# Patient Record
Sex: Female | Born: 1955 | Hispanic: Yes | Marital: Married | Smoking: Never smoker
Health system: Southern US, Community
[De-identification: ages and names within clinical notes are randomized; demographics above are authoritative.]

## PROBLEM LIST (undated history)

## (undated) DIAGNOSIS — G709 Myoneural disorder, unspecified: Secondary | ICD-10-CM

## (undated) DIAGNOSIS — E079 Disorder of thyroid, unspecified: Secondary | ICD-10-CM

## (undated) DIAGNOSIS — E119 Type 2 diabetes mellitus without complications: Secondary | ICD-10-CM

## (undated) DIAGNOSIS — K8050 Calculus of bile duct without cholangitis or cholecystitis without obstruction: Secondary | ICD-10-CM

## (undated) DIAGNOSIS — G35 Multiple sclerosis: Secondary | ICD-10-CM

## (undated) DIAGNOSIS — E785 Hyperlipidemia, unspecified: Secondary | ICD-10-CM

## (undated) HISTORY — DX: Calculus of bile duct without cholangitis or cholecystitis without obstruction: K80.50

---

## 2018-01-11 ENCOUNTER — Other Ambulatory Visit: Payer: Self-pay

## 2018-01-11 ENCOUNTER — Emergency Department: Payer: Medicare Other

## 2018-01-11 ENCOUNTER — Inpatient Hospital Stay
Admission: EM | Admit: 2018-01-11 | Discharge: 2018-01-13 | DRG: 446 | Disposition: A | Discharge status: Home / Self Care | Payer: Medicare Other | Attending: Internal Medicine | Admitting: Internal Medicine

## 2018-01-11 DIAGNOSIS — Z993 Dependence on wheelchair: Secondary | ICD-10-CM | POA: Diagnosis not present

## 2018-01-11 DIAGNOSIS — R52 Pain, unspecified: Secondary | ICD-10-CM

## 2018-01-11 DIAGNOSIS — E039 Hypothyroidism, unspecified: Secondary | ICD-10-CM | POA: Diagnosis not present

## 2018-01-11 DIAGNOSIS — Z7984 Long term (current) use of oral hypoglycemic drugs: Secondary | ICD-10-CM | POA: Diagnosis not present

## 2018-01-11 DIAGNOSIS — K8050 Calculus of bile duct without cholangitis or cholecystitis without obstruction: Secondary | ICD-10-CM

## 2018-01-11 DIAGNOSIS — E119 Type 2 diabetes mellitus without complications: Secondary | ICD-10-CM | POA: Diagnosis present

## 2018-01-11 DIAGNOSIS — Z79899 Other long term (current) drug therapy: Secondary | ICD-10-CM

## 2018-01-11 DIAGNOSIS — K8044 Calculus of bile duct with chronic cholecystitis without obstruction: Secondary | ICD-10-CM | POA: Diagnosis present

## 2018-01-11 DIAGNOSIS — G35 Multiple sclerosis: Secondary | ICD-10-CM | POA: Diagnosis present

## 2018-01-11 DIAGNOSIS — E785 Hyperlipidemia, unspecified: Secondary | ICD-10-CM | POA: Diagnosis present

## 2018-01-11 DIAGNOSIS — Z7401 Bed confinement status: Secondary | ICD-10-CM | POA: Diagnosis not present

## 2018-01-11 HISTORY — DX: Type 2 diabetes mellitus without complications: E11.9

## 2018-01-11 HISTORY — DX: Hyperlipidemia, unspecified: E78.5

## 2018-01-11 HISTORY — DX: Multiple sclerosis: G35

## 2018-01-11 HISTORY — DX: Calculus of bile duct without cholangitis or cholecystitis without obstruction: K80.50

## 2018-01-11 HISTORY — DX: Disorder of thyroid, unspecified: E07.9

## 2018-01-11 LAB — COMPREHENSIVE METABOLIC PANEL
ALT: 213 U/L — ABNORMAL HIGH (ref 14–54)
AST: 79 U/L — ABNORMAL HIGH (ref 15–41)
Albumin: 3.7 g/dL (ref 3.5–5.0)
Alkaline Phosphatase: 179 U/L — ABNORMAL HIGH (ref 38–126)
Anion gap: 11 (ref 5–15)
BUN: 7 mg/dL (ref 6–20)
CO2: 25 mmol/L (ref 22–32)
Calcium: 9.6 mg/dL (ref 8.9–10.3)
Chloride: 104 mmol/L (ref 101–111)
Creatinine, Ser: 0.57 mg/dL (ref 0.44–1.00)
Glucose, Bld: 143 mg/dL — ABNORMAL HIGH (ref 65–99)
POTASSIUM: 4.2 mmol/L (ref 3.5–5.1)
Sodium: 140 mmol/L (ref 135–145)
Total Bilirubin: 1.6 mg/dL — ABNORMAL HIGH (ref 0.3–1.2)
Total Protein: 7.7 g/dL (ref 6.5–8.1)

## 2018-01-11 LAB — CBC
HEMATOCRIT: 42.5 % (ref 35.0–47.0)
Hemoglobin: 14 g/dL (ref 12.0–16.0)
MCH: 29.1 pg (ref 26.0–34.0)
MCHC: 32.9 g/dL (ref 32.0–36.0)
MCV: 88.5 fL (ref 80.0–100.0)
PLATELETS: 361 10*3/uL (ref 150–440)
RBC: 4.81 MIL/uL (ref 3.80–5.20)
RDW: 14.7 % — ABNORMAL HIGH (ref 11.5–14.5)
WBC: 9.9 10*3/uL (ref 3.6–11.0)

## 2018-01-11 LAB — GLUCOSE, CAPILLARY: GLUCOSE-CAPILLARY: 106 mg/dL — AB (ref 65–99)

## 2018-01-11 LAB — LIPASE, BLOOD: LIPASE: 47 U/L (ref 11–51)

## 2018-01-11 LAB — BILIRUBIN, DIRECT: Bilirubin, Direct: 0.5 mg/dL (ref 0.1–0.5)

## 2018-01-11 MED ORDER — OXYCODONE HCL 5 MG PO TABS
5.0000 mg | ORAL_TABLET | ORAL | Status: DC | PRN
  Administered 2018-01-13: 5 mg via ORAL
  Filled 2018-01-11: qty 1

## 2018-01-11 MED ORDER — ACETAMINOPHEN 325 MG PO TABS: 650.0000 mg | ORAL_TABLET | Freq: Four times a day (QID) | ORAL | Status: DC | PRN

## 2018-01-11 MED ORDER — ONDANSETRON HCL 4 MG PO TABS: 4.0000 mg | ORAL_TABLET | Freq: Four times a day (QID) | ORAL | Status: DC | PRN

## 2018-01-11 MED ORDER — ACETAMINOPHEN 650 MG RE SUPP: 650.0000 mg | Freq: Four times a day (QID) | RECTAL | Status: DC | PRN

## 2018-01-11 MED ORDER — MORPHINE SULFATE (PF) 2 MG/ML IV SOLN
2.0000 mg | INTRAVENOUS | Status: DC | PRN
Start: 2018-01-11 — End: 2018-01-13

## 2018-01-11 MED ORDER — ONDANSETRON HCL 4 MG/2ML IJ SOLN
4.0000 mg | Freq: Four times a day (QID) | INTRAMUSCULAR | Status: DC | PRN
Start: 2018-01-11 — End: 2018-01-13

## 2018-01-11 MED ORDER — SODIUM CHLORIDE 0.9 % IV SOLN
INTRAVENOUS | Status: DC
  Administered 2018-01-11 – 2018-01-13 (×3): via INTRAVENOUS

## 2018-01-11 MED ORDER — INSULIN ASPART 100 UNIT/ML ~~LOC~~ SOLN
0.0000 [IU] | Freq: Three times a day (TID) | SUBCUTANEOUS | Status: DC
  Administered 2018-01-12 – 2018-01-13 (×2): 1 [IU] via SUBCUTANEOUS
  Filled 2018-01-11: qty 0.09
  Filled 2018-01-11: qty 1
  Filled 2018-01-11 (×5): qty 0.09
  Filled 2018-01-11: qty 1
  Filled 2018-01-11 (×3): qty 0.09

## 2018-01-11 MED ORDER — HEPARIN SODIUM (PORCINE) 5000 UNIT/ML IJ SOLN
5000.0000 [IU] | Freq: Three times a day (TID) | INTRAMUSCULAR | Status: DC
  Administered 2018-01-12: 5000 [IU] via SUBCUTANEOUS
  Filled 2018-01-11 (×2): qty 1

## 2018-01-11 NOTE — H&P (Signed)
[employer] [facility] [place] [facility]   PATIENT NAME: Desiree Espinoza    MR#:  [id number]  DATE OF BIRTH:  [birth year]  DATE OF ADMISSION:  01/11/2018  PRIMARY CARE PHYSICIAN: No primary care provider on file.   REQUESTING/REFERRING PHYSICIAN:  Merrily Brittle, MD     CHIEF COMPLAINT:   Chief Complaint  Patient presents with  . Abdominal Pain    HISTORY OF PRESENT ILLNESS: Desiree Espinoza  is a 62 y.o. female with a known history of diabetes type 2, hyperlipidemia, MS, hypothyroidism who is presenting with abdominal pain ongoing for about a week.  Patient states that the pain is all over.  It alternates with sharp pain with dull pain.  Also has associated nausea and vomiting.  She has not had any fevers or chills.  Patient in the emergency room is noted to have  gallstones with with likely stone in the common bile duct.  The ER physician has discussed the case with surgery who recommends admission to the medicine service and GI consult for ERCP. PAST MEDICAL HISTORY:   Past Medical History:  Diagnosis Date  . Diabetes mellitus without complication (HCC)   . Hyperlipemia   . MS (multiple sclerosis) (HCC)   . Thyroid disease     PAST SURGICAL HISTORY: History reviewed. No pertinent surgical history.  SOCIAL HISTORY:  Social History   Tobacco Use  . Smoking status: Never Smoker  . Smokeless tobacco: Never Used  Substance Use Topics  . Alcohol use: No    Frequency: Never    FAMILY HISTORY: No family history on file.  DRUG ALLERGIES: No Known Allergies  REVIEW OF SYSTEMS:   CONSTITUTIONAL: No fever, fatigue or weakness.  EYES: No blurred or double vision.  EARS, NOSE, AND THROAT: No tinnitus or ear pain.  RESPIRATORY: No cough, shortness of breath, wheezing or hemoptysis.  CARDIOVASCULAR: No chest pain, orthopnea, edema.  GASTROINTESTINAL: No nausea, vomiting, diarrhea or positive abdominal pain.  GENITOURINARY: No dysuria, hematuria.  ENDOCRINE: No  polyuria, nocturia,  HEMATOLOGY: No anemia, easy bruising or bleeding SKIN: No rash or lesion. MUSCULOSKELETAL: No joint pain or arthritis.   NEUROLOGIC: No tingling, numbness, weakness.  PSYCHIATRY: No anxiety or depression.   MEDICATIONS AT HOME:  Prior to Admission medications   Not on File      PHYSICAL EXAMINATION:   VITAL SIGNS: Blood pressure 117/75, pulse (!) 101, temperature 97.6 F (36.4 C), temperature source Oral, height 5\' 4"  (1.626 m), weight 140 lb (63.5 kg), SpO2 98 %.  GENERAL:  62 y.o.-year-old patient lying in the bed with no acute distress.  EYES: Pupils equal, round, reactive to light and accommodation. No scleral icterus. Extraocular muscles intact.  HEENT: Head atraumatic, normocephalic. Oropharynx and nasopharynx clear.  NECK:  Supple, no jugular venous distention. No thyroid enlargement, no tenderness.  LUNGS: Normal breath sounds bilaterally, no wheezing, rales,rhonchi or crepitation. No use of accessory muscles of respiration.  CARDIOVASCULAR: S1, S2 normal. No murmurs, rubs, or gallops.  ABDOMEN: Soft, right upper quadrant tenderness, nondistended. Bowel sounds present. No organomegaly or mass.  EXTREMITIES: No pedal edema, cyanosis, or clubbing.  NEUROLOGIC: Cranial nerves II through XII are intact. Muscle strength 5/5 in all extremities. Sensation intact. Gait not checked.  PSYCHIATRIC: The patient is alert and oriented x 3.  SKIN: No obvious rash, lesion, or ulcer.   LABORATORY PANEL:   CBC Recent Labs  Lab 01/11/18 1654  WBC 9.9  HGB 14.0  HCT 42.5  PLT 361  MCV 88.5  MCH 29.1  MCHC 32.9  RDW 14.7*   ------------------------------------------------------------------------------------------------------------------  Chemistries  Recent Labs  Lab 01/11/18 1654  NA 140  K 4.2  CL 104  CO2 25  GLUCOSE 143*  BUN 7  CREATININE 0.57  CALCIUM 9.6  AST 79*  ALT 213*  ALKPHOS 179*  BILITOT 1.6*    ------------------------------------------------------------------------------------------------------------------ estimated creatinine clearance is 65.4 mL/min (by C-G formula based on SCr of 0.57 mg/dL). ------------------------------------------------------------------------------------------------------------------ No results for input(s): TSH, T4TOTAL, T3FREE, THYROIDAB in the last 72 hours.  Invalid input(s): FREET3   Coagulation profile No results for input(s): INR, PROTIME in the last 168 hours. ------------------------------------------------------------------------------------------------------------------- No results for input(s): DDIMER in the last 72 hours. -------------------------------------------------------------------------------------------------------------------  Cardiac Enzymes No results for input(s): CKMB, TROPONINI, MYOGLOBIN in the last 168 hours.  Invalid input(s): CK ------------------------------------------------------------------------------------------------------------------ Invalid input(s): POCBNP  ---------------------------------------------------------------------------------------------------------------  Urinalysis No results found for: COLORURINE, APPEARANCEUR, LABSPEC, PHURINE, GLUCOSEU, HGBUR, BILIRUBINUR, KETONESUR, PROTEINUR, UROBILINOGEN, NITRITE, LEUKOCYTESUR   RADIOLOGY: Us Abdomen Limited Ruq  Result Date: 01/11/2018 CLINICAL DATA:  Epigastric pain.  Discolored stools. EXAM: ULTRASOUND ABDOMEN LIMITED RIGHT UPPER QUADRANT COMPARISON:  None. FINDINGS: Gallbladder: There is dense shadowing associated with the gallbladder suggesting a gallbladder filled with stones. No wall thickening appreciated. No pericholecystic fluid noted. A Murphy's sign is reported. Common bile duct: Diameter: 8.5 mm There is a 12.7 mm filling defect within the common bile duct. Liver: Increased echogenicity in the liver. Intrahepatic ductal dilatation noted.  Shadowing echogenicity in the liver may be associated with pneumobilia. Portal vein is patent on color Doppler imaging with normal direction of blood flow towards the liver. IMPRESSION: 1. There is a 12.7 mm filling defect in the common bile duct. There is common bile duct dilatation. Recommend an MRCP for better evaluation. 2. There is dense shadowing associated with the gallbladder suggesting a gallbladder filled with stones. A positive Murphy's sign is reported. No wall thickening or pericholecystic fluid. Recommend attention on MRCP. 3. Intrahepatic ductal dilatation. Shadowing echogenicity could represent pneumobilia. Recommend attention on MRCP. Electronically Signed   By: David  Williams III M.D   On: 01/11/2018 18:26    EKG: No orders found for this or any previous visit.  IMPRESSION AND PLAN: Patient is a 62 year old female with abdominal pain  1.  Choledocholithiasis Related to gallstones I have discussed with Dr. Toledo of GI who will discuss with Dr. Wall regarding ERCP tomorrow Patient will need surgery consult past the ERCP for cholecystectomy  2, diabetes type 2 hold her oral medications placed on sliding scale insulin  3.  Hyperlipidemia hold her medications  4.  Multiple sclerosis  5.  Miscellaneous SCDs for DVT prophylaxis  All the records are reviewed and case discussed with ED provider. Management plans discussed with the patient, family and they are in agreement.  CODE STATUS: Code Status History    This patient does not have a recorded code status. Please follow your organizational policy for patients in this situation.       TOTAL TIME TAKING CARE OF THIS PATIENT: <MEASURE<MEASUREME<MEASUREME<MEASUREME<MEASUREME<MEASUREME<[id number] Carolinal M.D on 01/11/2018 at 8:10 PM  Between 7am to 6pm - Pager - [phone]  After 6pm go to [email or web address] - password [email or web address]  [employer] Office  [phone]  CC: Primary care physician; No primary care provider on file.

## 2018-01-11 NOTE — Progress Notes (Signed)
Time hr behind 

## 2018-01-11 NOTE — ED Provider Notes (Signed)
[facility] Emergency Department Provider Note  ____________________________________________   First MD Initiated Contact with Patient 01/11/18 1931     (approximate)  I have reviewed the triage vital signs and the nursing notes.   HISTORY  Chief Complaint Abdominal Pain    HPI Desiree Espinoza is a 62 y.o. female who self presents to the emergency department with abdominal pain and change in color in her stools and urine.  She had 2-3 days of upper abdominal mild to moderate cramping discomfort.  Seems to be somewhat worse after eating and somewhat improved with rest.  Today her urine became quite dark in her stool became nearly silver colored which concerned her so she came to the emergency department.  She denies fevers or chills.  She does report nausea and has vomited twice.  She has a complex past medical history including dyslipidemia, multiple sclerosis, diabetes mellitus.  Nothing in particular seems to make her symptoms better or worse.  Past Medical History:  Diagnosis Date  . Diabetes mellitus without complication (HCC)   . Hyperlipemia   . MS (multiple sclerosis) (HCC)   . Thyroid disease     Patient Active Problem List   Diagnosis Date Noted  . Choledocholithiasis 01/11/2018    Past Surgical History:  Procedure Laterality Date  . ENDOSCOPIC RETROGRADE CHOLANGIOPANCREATOGRAPHY (ERCP) WITH PROPOFOL N/A 01/12/2018   Procedure: ENDOSCOPIC RETROGRADE CHOLANGIOPANCREATOGRAPHY (ERCP) WITH PROPOFOL;  Surgeon: Midge Minium, MD;  Location: [facility] ENDOSCOPY;  Service: Endoscopy;  Laterality: N/A;    Prior to Admission medications   Medication Sig Start Date End Date Taking? Authorizing Provider  atorvastatin (LIPITOR) 40 MG tablet Take 40 mg by mouth daily. 12/19/17  Yes [provider]  glipiZIDE (GLUCOTROL) 10 MG tablet Take 10 mg by mouth 2 (two) times daily. 12/19/17  Yes [provider]  JANUVIA 100 MG tablet Take 100 mg by mouth  daily. 11/22/17  Yes [provider]    Allergies Patient has no known allergies.  History reviewed. No pertinent family history.  Social History Social History   Tobacco Use  . Smoking status: Never Smoker  . Smokeless tobacco: Never Used  Substance Use Topics  . Alcohol use: No    Frequency: Never  . Drug use: No    Review of Systems Constitutional: No fever/chills Eyes: No visual changes. ENT: No sore throat. Cardiovascular: Denies chest pain. Respiratory: Denies shortness of breath. Gastrointestinal: Positive for abdominal pain.  Positive for nausea, positive for vomiting.  No diarrhea.  No constipation. Genitourinary: Negative for dysuria. Musculoskeletal: Negative for back pain. Skin: Negative for rash. Neurological: Negative for headaches, focal weakness or numbness.   ____________________________________________   PHYSICAL EXAM:  VITAL SIGNS: ED Triage Vitals  Enc Vitals Group     BP 01/11/18 1649 117/75     Pulse Rate 01/11/18 1649 (!) 101     Resp --      Temp 01/11/18 1649 97.6 F (36.4 C)     Temp Source 01/11/18 1649 Oral     SpO2 01/11/18 1649 98 %     Weight 01/11/18 1651 140 lb (63.5 kg)     Height 01/11/18 1651 5\' 4"  (1.626 m)     Head Circumference --      Peak Flow --      Pain Score --      Pain Loc --      Pain Edu? --      Excl. in GC? --     Constitutional:  Alert and oriented x4 sitting up in her wheelchair and appears somewhat uncomfortable nontoxic no diaphoresis speaks in full clear sentences Eyes: PERRL EOMI. Head: Atraumatic. Nose: No congestion/rhinnorhea. Mouth/Throat: No trismus Neck: No stridor.   Cardiovascular: Tachycardic with rate, regular rhythm. Grossly normal heart sounds.  Good peripheral circulation. Respiratory: Normal respiratory effort.  No retractions. Lungs CTAB and moving good air Gastrointestinal: Soft abdomen mild upper abdominal tenderness with negative Murphy's and no frank  peritonitis Musculoskeletal: No lower extremity edema   Neurologic:  Normal speech and language. No gross focal neurologic deficits are appreciated. Skin:  Skin is warm, dry and intact. No rash noted. Psychiatric: Mood and affect are normal. Speech and behavior are normal.    ____________________________________________   DIFFERENTIAL includes but not limited to  Biliary colic, cholecystitis, cholangitis, choledocholithiasis ____________________________________________   LABS (all labs ordered are listed, but only abnormal results are displayed)  Labs Reviewed  COMPREHENSIVE METABOLIC PANEL - Abnormal; Notable for the following components:      Result Value   Glucose, Bld 143 (*)    AST 79 (*)    ALT 213 (*)    Alkaline Phosphatase 179 (*)    Total Bilirubin 1.6 (*)    All other components within normal limits  CBC - Abnormal; Notable for the following components:   RDW 14.7 (*)    All other components within normal limits  URINALYSIS, COMPLETE (UACMP) WITH MICROSCOPIC - Abnormal; Notable for the following components:   Color, Urine AMBER (*)    APPearance CLOUDY (*)    Leukocytes, UA LARGE (*)    Squamous Epithelial / LPF 0-5 (*)    All other components within normal limits  CBC - Abnormal; Notable for the following components:   RDW 14.7 (*)    All other components within normal limits  COMPREHENSIVE METABOLIC PANEL - Abnormal; Notable for the following components:   Glucose, Bld 108 (*)    Calcium 8.5 (*)    Albumin 3.1 (*)    AST 63 (*)    ALT 164 (*)    Alkaline Phosphatase 132 (*)    Total Bilirubin 1.4 (*)    All other components within normal limits  GLUCOSE, CAPILLARY - Abnormal; Notable for the following components:   Glucose-Capillary 106 (*)    All other components within normal limits  GLUCOSE, CAPILLARY - Abnormal; Notable for the following components:   Glucose-Capillary 100 (*)    All other components within normal limits  GLUCOSE, CAPILLARY -  Abnormal; Notable for the following components:   Glucose-Capillary 137 (*)    All other components within normal limits  COMPREHENSIVE METABOLIC PANEL - Abnormal; Notable for the following components:   Glucose, Bld 145 (*)    BUN <5 (*)    Calcium 8.6 (*)    Albumin 3.4 (*)    AST 57 (*)    ALT 144 (*)    Alkaline Phosphatase 148 (*)    Total Bilirubin 1.4 (*)    All other components within normal limits  GLUCOSE, CAPILLARY - Abnormal; Notable for the following components:   Glucose-Capillary 171 (*)    All other components within normal limits  GLUCOSE, CAPILLARY - Abnormal; Notable for the following components:   Glucose-Capillary 136 (*)    All other components within normal limits  GLUCOSE, CAPILLARY - Abnormal; Notable for the following components:   Glucose-Capillary 137 (*)    All other components within normal limits  GLUCOSE, CAPILLARY - Abnormal; Notable for the following  components:   Glucose-Capillary 140 (*)    All other components within normal limits  LIPASE, BLOOD  BILIRUBIN, DIRECT  GLUCOSE, CAPILLARY  GLUCOSE, CAPILLARY  LIPASE, BLOOD    Lab work reviewed by me with transaminitis and elevated bilirubin concerning for obstruction __________________________________________  EKG   ____________________________________________  RADIOLOGY  Right upper quadrant ultrasound reviewed by me concerning for biliary obstruction and choledocholithiasis ____________________________________________   PROCEDURES  Procedure(s) performed: no  Procedures  Critical Care performed: no  Observation: no ____________________________________________   INITIAL IMPRESSION / ASSESSMENT AND PLAN / ED COURSE  Pertinent labs & imaging results that were available during my care of the patient were reviewed by me and considered in my medical decision making (see chart for details).  The patient arrives with upper abdominal discomfort and nausea along with change in  urine and stools and elevated transaminases concerning for choledocholithiasis.  Right upper quadrant ultrasound confirms gallstones as well as biliary dilation.     ----------------------------------------- 7:31 PM on 01/11/2018 -----------------------------------------  I discussed the case with on-call general surgeon Dr. Tonita Cong who recommends admission to the medicine service for GI consultation and ERCP.  I discussed with the patient and family who verbalized understanding and agreement with the plan.  I then discussed with the hospitalist who has graciously agreed to admit the patient to his service. ____________________________________________   FINAL CLINICAL IMPRESSION(S) / ED DIAGNOSES  Final diagnoses:  Pain  Choledocholithiasis      NEW MEDICATIONS STARTED DURING THIS VISIT:  Discharge Medication List as of 01/13/2018 11:58 AM       Note:  This document was prepared using Dragon voice recognition software and may include unintentional dictation errors.     Merrily Brittle, MD 01/15/18 1325

## 2018-01-11 NOTE — ED Triage Notes (Signed)
Pt c/o abd pain with silver colored stool for the past couple of days and also having dark colored urine, N/V x2.Marland Kitchen

## 2018-01-12 ENCOUNTER — Encounter
Admission: EM | Disposition: A | Discharge status: Home / Self Care | Payer: Self-pay | Source: Home / Self Care | Attending: Internal Medicine

## 2018-01-12 ENCOUNTER — Inpatient Hospital Stay: Payer: Medicare Other | Admitting: Anesthesiology

## 2018-01-12 ENCOUNTER — Encounter: Payer: Self-pay | Admitting: Anesthesiology

## 2018-01-12 ENCOUNTER — Inpatient Hospital Stay: Payer: Medicare Other

## 2018-01-12 DIAGNOSIS — K8050 Calculus of bile duct without cholangitis or cholecystitis without obstruction: Secondary | ICD-10-CM | POA: Diagnosis not present

## 2018-01-12 HISTORY — PX: ENDOSCOPIC RETROGRADE CHOLANGIOPANCREATOGRAPHY (ERCP) WITH PROPOFOL: SHX5810

## 2018-01-12 LAB — CBC
HEMATOCRIT: 37.7 % (ref 35.0–47.0)
Hemoglobin: 12.6 g/dL (ref 12.0–16.0)
MCH: 29.3 pg (ref 26.0–34.0)
MCHC: 33.6 g/dL (ref 32.0–36.0)
MCV: 87.3 fL (ref 80.0–100.0)
Platelets: 325 10*3/uL (ref 150–440)
RBC: 4.32 MIL/uL (ref 3.80–5.20)
RDW: 14.7 % — ABNORMAL HIGH (ref 11.5–14.5)
WBC: 8.4 10*3/uL (ref 3.6–11.0)

## 2018-01-12 LAB — URINALYSIS, COMPLETE (UACMP) WITH MICROSCOPIC
Bacteria, UA: NONE SEEN
Bilirubin Urine: NEGATIVE
GLUCOSE, UA: NEGATIVE mg/dL
Hgb urine dipstick: NEGATIVE
Ketones, ur: NEGATIVE mg/dL
Nitrite: NEGATIVE
PH: 5 (ref 5.0–8.0)
PROTEIN: NEGATIVE mg/dL
Specific Gravity, Urine: 1.011 (ref 1.005–1.030)

## 2018-01-12 LAB — COMPREHENSIVE METABOLIC PANEL
ALBUMIN: 3.1 g/dL — AB (ref 3.5–5.0)
ALT: 164 U/L — ABNORMAL HIGH (ref 14–54)
ANION GAP: 8 (ref 5–15)
AST: 63 U/L — ABNORMAL HIGH (ref 15–41)
Alkaline Phosphatase: 132 U/L — ABNORMAL HIGH (ref 38–126)
BILIRUBIN TOTAL: 1.4 mg/dL — AB (ref 0.3–1.2)
BUN: 6 mg/dL (ref 6–20)
CO2: 25 mmol/L (ref 22–32)
Calcium: 8.5 mg/dL — ABNORMAL LOW (ref 8.9–10.3)
Chloride: 106 mmol/L (ref 101–111)
Creatinine, Ser: 0.56 mg/dL (ref 0.44–1.00)
GFR calc Af Amer: >60 mL/min (ref >60)
GFR calc non Af Amer: >60 mL/min (ref >60)
GLUCOSE: 108 mg/dL — AB (ref 65–99)
POTASSIUM: 3.8 mmol/L (ref 3.5–5.1)
SODIUM: 139 mmol/L (ref 135–145)
TOTAL PROTEIN: 6.6 g/dL (ref 6.5–8.1)

## 2018-01-12 LAB — GLUCOSE, CAPILLARY
GLUCOSE-CAPILLARY: 92 mg/dL (ref 65–99)
GLUCOSE-CAPILLARY: 90 mg/dL (ref 65–99)
GLUCOSE-CAPILLARY: 137 mg/dL — AB (ref 65–99)
Glucose-Capillary: 100 mg/dL — ABNORMAL HIGH (ref 65–99)

## 2018-01-12 SURGERY — ENDOSCOPIC RETROGRADE CHOLANGIOPANCREATOGRAPHY (ERCP) WITH PROPOFOL
Anesthesia: General

## 2018-01-12 MED ORDER — ENOXAPARIN SODIUM 40 MG/0.4ML ~~LOC~~ SOLN
40.0000 mg | SUBCUTANEOUS | Status: DC
  Administered 2018-01-12: 40 mg via SUBCUTANEOUS
  Filled 2018-01-12: qty 0.4

## 2018-01-12 MED ORDER — LABETALOL HCL 5 MG/ML IV SOLN
INTRAVENOUS | Status: DC | PRN
  Administered 2018-01-12 (×2): 5 mg via INTRAVENOUS

## 2018-01-12 MED ORDER — LIDOCAINE HCL (PF) 2 % IJ SOLN
INTRAMUSCULAR | Status: AC
  Filled 2018-01-12: qty 10

## 2018-01-12 MED ORDER — PROPOFOL 10 MG/ML IV BOLUS
INTRAVENOUS | Status: AC
  Filled 2018-01-12: qty 20

## 2018-01-12 MED ORDER — SUCCINYLCHOLINE CHLORIDE 20 MG/ML IJ SOLN
INTRAMUSCULAR | Status: AC
  Filled 2018-01-12: qty 1

## 2018-01-12 MED ORDER — LIDOCAINE HCL (CARDIAC) 20 MG/ML IV SOLN
INTRAVENOUS | Status: DC | PRN
  Administered 2018-01-12: 80 mg via INTRAVENOUS

## 2018-01-12 MED ORDER — LABETALOL HCL 5 MG/ML IV SOLN
INTRAVENOUS | Status: AC
  Filled 2018-01-12: qty 4

## 2018-01-12 MED ORDER — INDOMETHACIN 50 MG RE SUPP
RECTAL | Status: AC
  Filled 2018-01-12: qty 2

## 2018-01-12 MED ORDER — INDOMETHACIN 50 MG RE SUPP
100.0000 mg | Freq: Once | RECTAL | Status: AC
  Administered 2018-01-12: 100 mg via RECTAL
  Filled 2018-01-12: qty 2

## 2018-01-12 MED ORDER — PROPOFOL 10 MG/ML IV BOLUS
INTRAVENOUS | Status: DC | PRN
  Administered 2018-01-12: 60 mg via INTRAVENOUS

## 2018-01-12 MED ORDER — ONDANSETRON HCL 4 MG/2ML IJ SOLN
INTRAMUSCULAR | Status: AC
  Filled 2018-01-12: qty 2

## 2018-01-12 MED ORDER — PROPOFOL 500 MG/50ML IV EMUL
INTRAVENOUS | Status: DC | PRN
  Administered 2018-01-12: 200 ug/kg/min via INTRAVENOUS

## 2018-01-12 MED ORDER — ROCURONIUM BROMIDE 50 MG/5ML IV SOLN
INTRAVENOUS | Status: AC
  Filled 2018-01-12: qty 1

## 2018-01-12 MED ORDER — DEXAMETHASONE SODIUM PHOSPHATE 10 MG/ML IJ SOLN
INTRAMUSCULAR | Status: AC
  Filled 2018-01-12: qty 1

## 2018-01-12 NOTE — Op Note (Signed)
[facility] Patient Name: Desiree Espinoza Procedure Date: 01/12/2018 12:37 PM MRN: [id number] Account #: [id number] Date of Birth: [birth year] Admit Type: Inpatient Age: 62 Room: [facility] ENDO ROOM 4 Gender: Female Note Status: Finalized Procedure:            ERCP Indications:          Bile duct stone(s) Providers:            Midge Minium MD, MD Referring MD:         No Local Md, MD (Referring MD) Medicines:            Propofol per Anesthesia Complications:        No immediate complications. Procedure:            Pre-Anesthesia Assessment:                       - Prior to the procedure, a History and Physical was                        performed, and patient medications and allergies were                        reviewed. The patient's tolerance of previous                        anesthesia was also reviewed. The risks and benefits of                        the procedure and the sedation options and risks were                        discussed with the patient. All questions were                        answered, and informed consent was obtained. Prior                        Anticoagulants: The patient has taken no previous                        anticoagulant or antiplatelet agents. ASA Grade                        Assessment: II - A patient with mild systemic disease.                        After reviewing the risks and benefits, the patient was                        deemed in satisfactory condition to undergo the                        procedure.                       After obtaining informed consent, the scope was passed                        under direct vision. Throughout the procedure, the  patient's blood pressure, pulse, and oxygen saturations                        were monitored continuously. The ERCP was introduced                        through the mouth, and used to inject contrast into and                        used  to inject contrast into the bile duct. The ERCP                        was accomplished without difficulty. The patient                        tolerated the procedure well. Findings:      The scout film was normal. The major papilla was bulging. A straight       Roadrunner wire was passed into the biliary tree. The short-nosed       traction sphincterotome was passed over the guidewire and the bile duct       was then deeply cannulated. Contrast was injected. I personally       interpreted the bile duct images. There was brisk flow of contrast       through the ducts. Image quality was excellent. Contrast extended to the       entire biliary tree. The main bile duct contained multiple stones mm. A       10 mm biliary sphincterotomy was made with a traction (standard)       sphincterotome using ERBE electrocautery. There was no       post-sphincterotomy bleeding. The biliary tree was swept with a 15 mm       balloon starting at the bifurcation. All stones were removed. Impression:           - The major papilla appeared to be bulging.                       - Choledocholithiasis was found. Complete removal was                        accomplished by biliary sphincterotomy and balloon                        extraction.                       - A biliary sphincterotomy was performed.                       - The biliary tree was swept. Recommendation:       - Return patient to hospital ward for ongoing care.                       - Watch for pancreatitis, bleeding, perforation, and                        cholangitis.                       - Clear liquid diet today. Procedure Code(s):    --- Professional ---  [phone], Endoscopic retrograde cholangiopancreatography                        (ERCP); with removal of calculi/debris from                        biliary/pancreatic duct(s)                       [id number], Endoscopic retrograde cholangiopancreatography                         (ERCP); with sphincterotomy/papillotomy                       [phone], Endoscopic catheterization of the biliary ductal                        system, radiological supervision and interpretation Diagnosis Code(s):    --- Professional ---                       K80.50, Calculus of bile duct without cholangitis or                        cholecystitis without obstruction CPT copyright 2016 American Medical Association. All rights reserved. The codes documented in this report are preliminary and upon coder review may  be revised to meet current compliance requirements. Midge Minium MD, MD 01/12/2018 1:24:24 PM This report has been signed electronically. Number of Addenda: 0 Note Initiated On: 01/12/2018 12:37 PM      [facility]

## 2018-01-12 NOTE — Transfer of Care (Signed)
2Immediate Anesthesia Transfer of Care Note  Patient: Desiree Espinoza  Procedure(s) Performed: ENDOSCOPIC RETROGRADE CHOLANGIOPANCREATOGRAPHY (ERCP) WITH PROPOFOL (N/A )  Patient Location: PACU  Anesthesia Type:General  Level of Consciousness: awake  Airway & Oxygen Therapy: Patient Spontanous Breathing  Post-op Assessment: Report given to RN  Post vital signs: stable  Last Vitals:  Vitals:   01/12/18 1325 01/12/18 1327  BP:    Pulse:  (!) 115  Resp:  19  Temp: (!) 35.9 C (!) 35.9 C  SpO2:  94%    Last Pain:  Vitals:   01/12/18 1327  TempSrc: Skin  PainSc:          Complications: No apparent anesthesia complications

## 2018-01-12 NOTE — Progress Notes (Signed)
[facility] [place] at [facility]   PATIENT NAME: Desiree Espinoza    MR#:  [id number]  DATE OF BIRTH:  Nov 18, 1955  SUBJECTIVE:  CHIEF COMPLAINT:   Chief Complaint  Patient presents with  . Abdominal Pain   - admitted with abdominal pain and noted to have common bile duct stone. -Pain is improving. For ERCP today  REVIEW OF SYSTEMS:  Review of Systems  Constitutional: Positive for malaise/fatigue. Negative for chills and fever.  HENT: Negative for congestion, ear discharge, hearing loss and nosebleeds.   Eyes: Negative for blurred vision and double vision.  Respiratory: Negative for cough, shortness of breath and wheezing.   Cardiovascular: Negative for chest pain and palpitations.  Gastrointestinal: Positive for abdominal pain. Negative for constipation, diarrhea, nausea and vomiting.  Genitourinary: Negative for dysuria.  Neurological: Positive for focal weakness. Negative for dizziness, seizures and headaches.    DRUG ALLERGIES:  No Known Allergies  VITALS:  Blood pressure 135/67, pulse (!) 107, temperature 98.1 F (36.7 C), temperature source Oral, resp. rate 18, height 5\' 4"  (1.626 m), weight 65.8 kg (145 lb), SpO2 96 %.  PHYSICAL EXAMINATION:  Physical Exam  GENERAL:  62 y.o.-year-old patient lying in the bed with no acute distress.  EYES: Pupils equal, round, reactive to light and accommodation. No scleral icterus. Extraocular muscles intact.  HEENT: Head atraumatic, normocephalic. Oropharynx and nasopharynx clear.  NECK:  Supple, no jugular venous distention. No thyroid enlargement, no tenderness.  LUNGS: Normal breath sounds bilaterally, no wheezing, rales,rhonchi or crepitation. No use of accessory muscles of respiration. Decreased bibasilar breath sounds. CARDIOVASCULAR: S1, S2 normal. No murmurs, rubs, or gallops.  ABDOMEN: Soft, nontender, nondistended. Bowel sounds present. No organomegaly or mass.  EXTREMITIES: No pedal edema, cyanosis,  or clubbing.  NEUROLOGIC: Cranial nerves II through XII are intact. Muscle strength 5/5 in both upper extremities, 2/5 in lower extremities chronic. Sensation intact. Gait not checked.  PSYCHIATRIC: The patient is alert and oriented x 3.  SKIN: No obvious rash, lesion, or ulcer.    LABORATORY PANEL:   CBC Recent Labs  Lab 01/12/18 0533  WBC 8.4  HGB 12.6  HCT 37.7  PLT 325   ------------------------------------------------------------------------------------------------------------------  Chemistries  Recent Labs  Lab 01/12/18 0533  NA 139  K 3.8  CL 106  CO2 25  GLUCOSE 108*  BUN 6  CREATININE 0.56  CALCIUM 8.5*  AST 63*  ALT 164*  ALKPHOS 132*  BILITOT 1.4*   ------------------------------------------------------------------------------------------------------------------  Cardiac Enzymes No results for input(s): TROPONINI in the last 168 hours. ------------------------------------------------------------------------------------------------------------------  RADIOLOGY:  US Abdomen Limited Ruq  Result Date: 01/11/2018 CLINICAL DATA:  Epigastric pain.  Discolored stools. EXAM: ULTRASOUND ABDOMEN LIMITED RIGHT UPPER QUADRANT COMPARISON:  None. FINDINGS: Gallbladder: There is dense shadowing associated with the gallbladder suggesting a gallbladder filled with stones. No wall thickening appreciated. No pericholecystic fluid noted. A Murphy's sign is reported. Common bile duct: Diameter: 8.5 mm There is a 12.7 mm filling defect within the common bile duct. Liver: Increased echogenicity in the liver. Intrahepatic ductal dilatation noted. Shadowing echogenicity in the liver may be associated with pneumobilia. Portal vein is patent on color Doppler imaging with normal direction of blood flow towards the liver. IMPRESSION: 1. There is a 12.7 mm filling defect in the common bile duct. There is common bile duct dilatation. Recommend an MRCP for better evaluation. 2. There is dense  shadowing associated with the gallbladder suggesting a gallbladder filled with stones. A positive Murphy's sign is  reported. No wall thickening or pericholecystic fluid. Recommend attention on MRCP. 3. Intrahepatic ductal dilatation. Shadowing echogenicity could represent pneumobilia. Recommend attention on MRCP. Electronically Signed   By: Gerome Sam III M.D   On: 01/11/2018 18:26    EKG:  No orders found for this or any previous visit.  ASSESSMENT AND PLAN:   62 y/o F with PMH significant for NIDDM, hyperlipidemia admitted for abdominal pain and noted to have CBD stone.  1. Choledocholithiasis- symptomatic - appreciate GI consult, remains NPO - [place] abd with 12.28mm dilated CBD and filling defect - for ERCP today. Monitor for any post procedural pancreatitis -Follow-up LFTs tomorrow. -Diet will be started after ERCP  2. Diabetes mellitus-only on sliding scale insulin since nothing by mouth  3.hyperlipidemia-hold statin until LFTs are better  4. DVT Prophylaxis- restart lovenox tonight  5. Multiple sclerosis- at baseline, bedbound due to lower extremity weakness   All the records are reviewed and case discussed with Care Management/Social Workerr. Management plans discussed with the patient, family and they are in agreement.  CODE STATUS: Full Code  TOTAL TIME TAKING CARE OF THIS PATIENT: 38 minutes.   POSSIBLE D/C IN 1-2 DAYS, DEPENDING ON CLINICAL CONDITION.   Enid Baas M.D on 01/12/2018 at 12:20 PM  Between 7am to 6pm - Pager - [phone]  After 6pm go to [email or web address] - password [email or web address]  [facility]  Office  [phone]  CC: Primary care physician; Patient, No Pcp Per

## 2018-01-12 NOTE — Anesthesia Post-op Follow-up Note (Signed)
Anesthesia QCDR form completed.        

## 2018-01-12 NOTE — Anesthesia Preprocedure Evaluation (Addendum)
Anesthesia Evaluation  Patient identified by MRN, date of birth, ID band Patient awake    Reviewed: Allergy & Precautions, NPO status , Patient's Chart, lab work & pertinent test results  History of Anesthesia Complications (+) Emergence Delirium  Airway Mallampati: II  TM Distance: >3 FB     Dental   Pulmonary neg pulmonary ROS,    Pulmonary exam normal        Cardiovascular negative cardio ROS Normal cardiovascular exam     Neuro/Psych  Neuromuscular disease negative psych ROS   GI/Hepatic Neg liver ROS,   Endo/Other  diabetes  Renal/GU negative Renal ROS     Musculoskeletal negative musculoskeletal ROS (+)   Abdominal Normal abdominal exam  (+)   Peds negative pediatric ROS (+)  Hematology negative hematology ROS (+)   Anesthesia Other Findings Past Medical History: No date: Diabetes mellitus without complication (HCC) No date: Hyperlipemia No date: MS (multiple sclerosis) (HCC) No date: Thyroid disease    Wheelchair bound per patient...weakness of arms and legs... pulmonart  Status stable  Reproductive/Obstetrics                            Anesthesia Physical Anesthesia Plan  ASA: III  Anesthesia Plan: General   Post-op Pain Management:    Induction: Intravenous  PONV Risk Score and Plan: TIVA  Airway Management Planned: Nasal Cannula  Additional Equipment:   Intra-op Plan:   Post-operative Plan:   Informed Consent: I have reviewed the patients History and Physical, chart, labs and discussed the procedure including the risks, benefits and alternatives for the proposed anesthesia with the patient or authorized representative who has indicated his/her understanding and acceptance.   Dental advisory given  Plan Discussed with: CRNA and Surgeon  Anesthesia Plan Comments:         Anesthesia Quick Evaluation

## 2018-01-12 NOTE — Consult Note (Signed)
I was asked to see this patient who came in yesterday with questionable common bile duct stones.  The patient will be brought down for an ERCP today.

## 2018-01-12 NOTE — Progress Notes (Signed)
Patient seeks prayer and assurance for procedures. Husband is highly interested in discussing his spiritual viewpoint. Both asked for a follow-up for prayer and ministry of presence.

## 2018-01-12 NOTE — Anesthesia Postprocedure Evaluation (Signed)
Anesthesia Post Note  Patient: Desiree Espinoza  Procedure(s) Performed: ENDOSCOPIC RETROGRADE CHOLANGIOPANCREATOGRAPHY (ERCP) WITH PROPOFOL (N/A )  Patient location during evaluation: PACU Anesthesia Type: General Level of consciousness: awake and alert and oriented Pain management: pain level controlled Vital Signs Assessment: post-procedure vital signs reviewed and stable Respiratory status: spontaneous breathing Cardiovascular status: blood pressure returned to baseline Anesthetic complications: no     Last Vitals:  Vitals:   01/12/18 1327 01/12/18 1333  BP:  123/80  Pulse: (!) 115 (!) 112  Resp: 19 20  Temp: (!) 35.9 C   SpO2: 94% 94%    Last Pain:  Vitals:   01/12/18 1327  TempSrc: Skin  PainSc:                  [name]

## 2018-01-13 ENCOUNTER — Encounter: Payer: Self-pay | Admitting: Gastroenterology

## 2018-01-13 DIAGNOSIS — K8050 Calculus of bile duct without cholangitis or cholecystitis without obstruction: Secondary | ICD-10-CM | POA: Diagnosis not present

## 2018-01-13 LAB — COMPREHENSIVE METABOLIC PANEL
ALK PHOS: 148 U/L — AB (ref 38–126)
ALT: 144 U/L — AB (ref 14–54)
ANION GAP: 8 (ref 5–15)
AST: 57 U/L — ABNORMAL HIGH (ref 15–41)
Albumin: 3.4 g/dL — ABNORMAL LOW (ref 3.5–5.0)
BUN: <5 mg/dL — ABNORMAL LOW (ref 6–20)
CALCIUM: 8.6 mg/dL — AB (ref 8.9–10.3)
CO2: 25 mmol/L (ref 22–32)
CREATININE: 0.55 mg/dL (ref 0.44–1.00)
Chloride: 108 mmol/L (ref 101–111)
GFR calc non Af Amer: >60 mL/min (ref >60)
GLUCOSE: 145 mg/dL — AB (ref 65–99)
Potassium: 3.6 mmol/L (ref 3.5–5.1)
Sodium: 141 mmol/L (ref 135–145)
TOTAL PROTEIN: 6.9 g/dL (ref 6.5–8.1)
Total Bilirubin: 1.4 mg/dL — ABNORMAL HIGH (ref 0.3–1.2)

## 2018-01-13 LAB — GLUCOSE, CAPILLARY
GLUCOSE-CAPILLARY: 171 mg/dL — AB (ref 65–99)
Glucose-Capillary: 136 mg/dL — ABNORMAL HIGH (ref 65–99)
Glucose-Capillary: 137 mg/dL — ABNORMAL HIGH (ref 65–99)
Glucose-Capillary: 140 mg/dL — ABNORMAL HIGH (ref 65–99)

## 2018-01-13 LAB — LIPASE, BLOOD: Lipase: 42 U/L (ref 11–51)

## 2018-01-13 NOTE — Discharge Summary (Signed)
[facility] [place] at [facility]   PATIENT NAME: Desiree Espinoza    MR#:  [id number]  DATE OF BIRTH:  1956-01-16  DATE OF ADMISSION:  01/11/2018   ADMITTING PHYSICIAN: Auburn Bilberry, MD  DATE OF DISCHARGE: 01/13/18  PRIMARY CARE PHYSICIAN: Patient, No Pcp Per   ADMISSION DIAGNOSIS:   Choledocholithiasis [K80.50] Pain [R52]  DISCHARGE DIAGNOSIS:   Active Problems:   Choledocholithiasis   SECONDARY DIAGNOSIS:   Past Medical History:  Diagnosis Date  . Diabetes mellitus without complication (HCC)   . Hyperlipemia   . MS (multiple sclerosis) (HCC)   . Thyroid disease     HOSPITAL COURSE:   62 y/o F with PMH significant for NIDDM, hyperlipidemia admitted for abdominal pain and noted to have CBD stone.  1. Choledocholithiasis- symptomatic - appreciate GI consult - [place] abd with 12.36mm dilated CBD and filling defect - s/p ERCP and multiple gallstones extracted -patient will need cholecystectomy done. Surgical referral given as outpatient. -Sphincterotomy done as well. -LFTs are much improved. No postprocedural pancreatitis. -Patient tolerating a soft diet prior to discharge  2. Diabetes mellitus- sugars have been well controlled in the hospital. Her home medications of Januvia and glipizide are restarted at discharge.  3.hyperlipidemia-Since LFTs are improving, statin will be restarted at discharge.  4. Multiple sclerosis-patient is bedbound at baseline due to bilateral lower extremity weakness from her MS.  Seems very comfortable. Will be discharged home today    DISCHARGE CONDITIONS:   Guarded  CONSULTS OBTAINED:   GI consult by Dr. Servando Snare  DRUG ALLERGIES:   No Known Allergies DISCHARGE MEDICATIONS:   Allergies as of 01/13/2018   No Known Allergies     Medication List    TAKE these medications   atorvastatin 40 MG tablet Commonly known as:  LIPITOR Take 40 mg by mouth daily.   glipiZIDE 10 MG tablet Commonly known as:   GLUCOTROL Take 10 mg by mouth 2 (two) times daily.   JANUVIA 100 MG tablet Generic drug:  sitaGLIPtin Take 100 mg by mouth daily.        DISCHARGE INSTRUCTIONS:   1. Surgical follow-up and 1 week 2. PCP follow up in 1-2 weeks  DIET:   Diabetic diet  ACTIVITY:   Activity as tolerated  OXYGEN:   Home Oxygen: No.  Oxygen Delivery: room air  DISCHARGE LOCATION:   home   If you experience worsening of your admission symptoms, develop shortness of breath, life threatening emergency, suicidal or homicidal thoughts you must seek medical attention immediately by calling 911 or calling your MD immediately  if symptoms less severe.  You Must read complete instructions/literature along with all the possible adverse reactions/side effects for all the Medicines you take and that have been prescribed to you. Take any new Medicines after you have completely understood and accpet all the possible adverse reactions/side effects.   Please note  You were cared for by a hospitalist during your hospital stay. If you have any questions about your discharge medications or the care you received while you were in the hospital after you are discharged, you can call the unit and asked to speak with the hospitalist on call if the hospitalist that took care of you is not available. Once you are discharged, your primary care physician will handle any further medical issues. Please note that NO REFILLS for any discharge medications will be authorized once you are discharged, as it is imperative that you return to your primary care physician (  or establish a relationship with a primary care physician if you do not have one) for your aftercare needs so that they can reassess your need for medications and monitor your lab values.    On the day of Discharge:  VITAL SIGNS:   Blood pressure (!) 116/53, pulse 97, temperature (!) 97.4 F (36.3 C), temperature source Oral, resp. rate 20, height 5\' 4"  (1.626 m),  weight 65.8 kg (145 lb), SpO2 98 %.  PHYSICAL EXAMINATION:    GENERAL:  62 y.o.-year-old patient lying in the bed with no acute distress.  EYES: Pupils equal, round, reactive to light and accommodation. No scleral icterus. Extraocular muscles intact.  HEENT: Head atraumatic, normocephalic. Oropharynx and nasopharynx clear.  NECK:  Supple, no jugular venous distention. No thyroid enlargement, no tenderness.  LUNGS: Normal breath sounds bilaterally, no wheezing, rales,rhonchi or crepitation. No use of accessory muscles of respiration. Decreased bibasilar breath sounds. CARDIOVASCULAR: S1, S2 normal. No murmurs, rubs, or gallops.  ABDOMEN: Soft, nontender, nondistended. Bowel sounds present. No organomegaly or mass.  EXTREMITIES: No pedal edema, cyanosis, or clubbing.  NEUROLOGIC: Cranial nerves II through XII are intact. Muscle strength 5/5 in both upper extremities, 2/5 in lower extremities chronic. Sensation intact. Gait not checked.  PSYCHIATRIC: The patient is alert and oriented x 3.  SKIN: No obvious rash, lesion, or ulcer.     DATA REVIEW:   CBC Recent Labs  Lab 01/12/18 0533  WBC 8.4  HGB 12.6  HCT 37.7  PLT 325    Chemistries  Recent Labs  Lab 01/13/18 0448  NA 141  K 3.6  CL 108  CO2 25  GLUCOSE 145*  BUN <5*  CREATININE 0.55  CALCIUM 8.6*  AST 57*  ALT 144*  ALKPHOS 148*  BILITOT 1.4*     Microbiology Results  No results found for this or any previous visit.  RADIOLOGY:  Dg C-arm 1-60 Min-no Report  Result Date: 01/12/2018 Fluoroscopy was utilized by the requesting physician.  No radiographic interpretation.     Management plans discussed with the patient, family and they are in agreement.  CODE STATUS:     Code Status Orders  (From admission, onward)        Start     Ordered   01/11/18 2156  Full code  Continuous     01/11/18 2155    Code Status History    Date Active Date Inactive Code Status Order ID Comments User Context   This  patient has a current code status but no historical code status.      TOTAL TIME TAKING CARE OF THIS PATIENT: 38 minutes.    Enid Baas M.D on 01/13/2018 at 11:07 AM  Between 7am to 6pm - Pager - [phone]  After 6pm go to [email or web address] - Social research officer, government  [facility]  Office  [phone]  CC: Primary care physician; Patient, No Pcp Per   Note: This dictation was prepared with Dragon dictation along with smaller phrase technology. Any transcriptional errors that result from this process are unintentional.

## 2018-01-13 NOTE — Progress Notes (Signed)
Pt was given D/C instructions and was alerted to the need to make her follow up appt with Dr. Tonita Cong and she stated that she would make it. I removed her IV without complication. I released her to her husband whom signed her D/C form since she was unable to. I released her to her niece.

## 2018-01-15 ENCOUNTER — Inpatient Hospital Stay: Payer: Self-pay | Admitting: Surgery

## 2018-01-19 ENCOUNTER — Ambulatory Visit (INDEPENDENT_AMBULATORY_CARE_PROVIDER_SITE_OTHER): Payer: Medicare HMO | Admitting: Surgery

## 2018-01-19 ENCOUNTER — Telehealth: Payer: Self-pay

## 2018-01-19 ENCOUNTER — Encounter: Payer: Self-pay | Admitting: Surgery

## 2018-01-19 VITALS — BP 120/76 | HR 98 | Temp 97.5°F | Ht 64.0 in

## 2018-01-19 DIAGNOSIS — K8050 Calculus of bile duct without cholangitis or cholecystitis without obstruction: Secondary | ICD-10-CM | POA: Diagnosis not present

## 2018-01-19 NOTE — Telephone Encounter (Signed)
Flagged on EMMI report for not knowing who to call about changes in condition. Called and spoke with patient's daughter.  Daughter mentioned she was getting her mother ready for an appointment this afternoon with Dr. Tonita Cong at 2:45pm.  They are aware to follow up with him for any issues or questions.  No further concerns at this time. Thanked daughter for her time and informed her that they would receive one more automated call in a few days checking in once more.

## 2018-01-19 NOTE — Progress Notes (Signed)
01/19/2018  Reason for Visit:  Choledocholithiasis  History of Present Illness: Desiree Espinoza is a 62 y.o. female who presents for follow up of choledocholithiasis.  She was hospitalized on 3/10 with choledocholithiasis and had an ERCP on 3/11.  Surgery consult was not obtained at the time and patient was discharged to home on 3/12 with outpatient surgery referral.  Patient presents today for follow up.  She reports that she has been doing well, without any further nausea or vomiting or any worsening pain.  She reports her stools are back to normal color and she denies any further jaundice.  Denies any fevers or chills, chest pain or shortness of breath.  Of note, the patient has multiple sclerosis and comes today on a wheelchair and is wheelchair bound mostly.  Past Medical History: Past Medical History:  Diagnosis Date  . Choledocholithiasis 01/11/2018  . Diabetes mellitus without complication (HCC)   . Hyperlipemia   . MS (multiple sclerosis) (HCC)   . Thyroid disease      Past Surgical History: Past Surgical History:  Procedure Laterality Date  . ENDOSCOPIC RETROGRADE CHOLANGIOPANCREATOGRAPHY (ERCP) WITH PROPOFOL N/A 01/12/2018   Procedure: ENDOSCOPIC RETROGRADE CHOLANGIOPANCREATOGRAPHY (ERCP) WITH PROPOFOL;  Surgeon: Midge Minium, MD;  Location: [facility] ENDOSCOPY;  Service: Endoscopy;  Laterality: N/A;    Home Medications: Prior to Admission medications   Medication Sig Start Date End Date Taking? Authorizing Provider  ACCU-CHEK SMARTVIEW test strip  12/19/17  Yes [provider]  atorvastatin (LIPITOR) 40 MG tablet Take 40 mg by mouth daily. 12/19/17  Yes [provider]  Colloidal Oatmeal (NATURAL OATMEAL BATH TREATMENT EX)  12/11/17  Yes [provider]  glipiZIDE (GLUCOTROL) 10 MG tablet Take 10 mg by mouth 2 (two) times daily. 12/19/17  Yes [provider]    Allergies: No Known Allergies  Social History:  reports that  has never smoked. she  has never used smokeless tobacco. She reports that she does not drink alcohol or use drugs.   Family History: Family History  Problem Relation Age of Onset  . Diabetes Mother   . Throat cancer Father   . Breast cancer Sister   . Breast cancer Sister     Review of Systems: Review of Systems  Constitutional: Negative for chills and fever.  Eyes: Negative for blurred vision.  Respiratory: Negative for shortness of breath.   Cardiovascular: Negative for chest pain.  Gastrointestinal: Negative for abdominal pain, constipation, diarrhea, nausea and vomiting.  Genitourinary: Negative for dysuria.  Musculoskeletal: Negative for myalgias.  Skin: Negative for rash.  Neurological: Negative for dizziness.  Psychiatric/Behavioral: Negative for depression.  All other systems reviewed and are negative.   Physical Exam BP 120/76   Pulse 98   Temp (!) 97.5 F (36.4 C) (Oral)   Ht 5\' 4"  (1.626 m)   BMI 24.89 kg/m  CONSTITUTIONAL: No acute distress HEENT:  Normocephalic, atraumatic, extraocular motion intact. NECK: Trachea is midline, and there is no jugular venous distension.  RESPIRATORY:  Lungs are clear, and breath sounds are equal bilaterally. Normal respiratory effort without pathologic use of accessory muscles. CARDIOVASCULAR: Heart is regular without murmurs, gallops, or rubs. GI: The abdomen is soft, nondistended, with some discomfort to palpation over the right upper quadrant.  Negative Murphy's sign.  MUSCULOSKELETAL:  Unable to fully assess as patient is on a wheelchair. SKIN: Skin turgor is normal. There are no pathologic skin lesions.  NEUROLOGIC:  Unable to fully assess given MS. PSYCH:  Alert and oriented to person,  place and time. Affect is normal.  Laboratory Analysis: Labs 3/12 after ERCP:  Total bilirubin 1.4, AST 57, ALT 144, AP 148.  Imaging: U/S 3/10: IMPRESSION: 1. There is a 12.7 mm filling defect in the common bile duct. There is common bile duct dilatation.  Recommend an MRCP for better evaluation. 2. There is dense shadowing associated with the gallbladder suggesting a gallbladder filled with stones. A positive Murphy's sign is reported. No wall thickening or pericholecystic fluid. Recommend attention on MRCP. 3. Intrahepatic ductal dilatation. Shadowing echogenicity could represent pneumobilia. Recommend attention on MRCP.  Assessment and Plan: This is a 62 y.o. female who presents with recent choledocholithiasis s/p ERCP on 3/11.  I have independently viewed the patient's imaging study and reviewed the patient's laboratory studies.  Patient is now s/p ERCP.  Discussed with the patient that the recommendation after this episode of choledocholithiasis would be to proceed with laparoscopic cholecystectomy.  Discussed with the patient the risk of bleeding, infection, and injury to surrounding structures, and risk to convert to open procedure.  Discussed with the patient as well that without surgery, there would be a risk of this episode happening again, but unfortunately I could not tell her when it would happen or if.  This episode could also turn into gallstone pancreatitis episode as well.  Given that, she has opted to proceed with cholecystectomy.  I would also do an intraoperative cholangiogram to evaluate her biliary anatomy.  She will be scheduled for 02/05/18.  Face-to-face time spent with the patient and care providers was 45 minutes, with more than 50% of the time spent counseling, educating, and coordinating care of the patient.     Howie Ill, MD [facility]

## 2018-01-19 NOTE — Patient Instructions (Signed)
You have requested to have your gallbladder removed. This will be done on 02/04/18 at [facility] with Dr. Henrene Dodge.  You will most likely be out of work 1-2 weeks for this surgery. You will return after your post-op appointment with a lifting restriction for approximately 4 more weeks.  You will be able to eat anything you would like to following surgery. But, start by eating a bland diet and advance this as tolerated. The Gallbladder diet is below, please go as closely by this diet as possible prior to surgery to avoid any further attacks.  Please see the (blue)pre-care form that you have been given today. If you have any questions, please call our office.  Laparoscopic Cholecystectomy Laparoscopic cholecystectomy is surgery to remove the gallbladder. The gallbladder is located in the upper right part of the abdomen, behind the liver. It is a storage sac for bile, which is produced in the liver. Bile aids in the digestion and absorption of fats. Cholecystectomy is often done for inflammation of the gallbladder (cholecystitis). This condition is usually caused by a buildup of gallstones (cholelithiasis) in the gallbladder. Gallstones can block the flow of bile, and that can result in inflammation and pain. In severe cases, emergency surgery may be required. If emergency surgery is not required, you will have time to prepare for the procedure. Laparoscopic surgery is an alternative to open surgery. Laparoscopic surgery has a shorter recovery time. Your common bile duct may also need to be examined during the procedure. If stones are found in the common bile duct, they may be removed. LET [facility] CARE PROVIDER KNOW ABOUT:  Any allergies you have.  All medicines you are taking, including vitamins, herbs, eye drops, creams, and over-the-counter medicines.  Previous problems you or members of your family have had with the use of anesthetics.  Any blood disorders you have.  Previous  surgeries you have had.    Any medical conditions you have. RISKS AND COMPLICATIONS Generally, this is a safe procedure. However, problems may occur, including:  Infection.  Bleeding.  Allergic reactions to medicines.  Damage to other structures or organs.  A stone remaining in the common bile duct.  A bile leak from the cyst duct that is clipped when your gallbladder is removed.  The need to convert to open surgery, which requires a larger incision in the abdomen. This may be necessary if your surgeon thinks that it is not safe to continue with a laparoscopic procedure. BEFORE THE PROCEDURE  Ask your health care provider about:  Changing or stopping your regular medicines. This is especially important if you are taking diabetes medicines or blood thinners.  Taking medicines such as aspirin and ibuprofen. These medicines can thin your blood. Do not take these medicines before your procedure if your health care provider instructs you not to.  Follow instructions from your health care provider about eating or drinking restrictions.  Let your health care provider know if you develop a cold or an infection before surgery.  Plan to have someone take you home after the procedure.  Ask your health care provider how your surgical site will be marked or identified.  You may be given antibiotic medicine to help prevent infection. PROCEDURE  To reduce your risk of infection:  Your health care team will wash or sanitize their hands.  Your skin will be washed with soap.  An IV tube may be inserted into one of your veins.  You will be given a medicine to  make you fall asleep (general anesthetic).  A breathing tube will be placed in your mouth.  The surgeon will make several small cuts (incisions) in your abdomen.  A thin, lighted tube (laparoscope) that has a tiny camera on the end will be inserted through one of the small incisions. The camera on the laparoscope will send a  picture to a TV screen (monitor) in the operating room. This will give the surgeon a good view inside your abdomen.  A gas will be pumped into your abdomen. This will expand your abdomen to give the surgeon more room to perform the surgery.  Other tools that are needed for the procedure will be inserted through the other incisions. The gallbladder will be removed through one of the incisions.  After your gallbladder has been removed, the incisions will be closed with stitches (sutures), staples, or skin glue.  Your incisions may be covered with a bandage (dressing). The procedure may vary among health care providers and hospitals. AFTER THE PROCEDURE  Your blood pressure, heart rate, breathing rate, and blood oxygen level will be monitored often until the medicines you were given have worn off.  You will be given medicines as needed to control your pain.   This information is not intended to replace advice given to you by your health care provider. Make sure you discuss any questions you have with your health care provider.   Document Released: 10/21/2005 Document Revised: 07/12/2015 Document Reviewed: 06/02/2013 Elsevier Interactive Patient Education 2016 Elsevier Inc.   Low-Fat Diet for Gallbladder Conditions A low-fat diet can be helpful if you have pancreatitis or a gallbladder condition. With these conditions, your pancreas and gallbladder have trouble digesting fats. A healthy eating plan with less fat will help rest your pancreas and gallbladder and reduce your symptoms. WHAT DO I NEED TO KNOW ABOUT THIS DIET?  Eat a low-fat diet.  Reduce your fat intake to less than 20-30% of your total daily calories. This is less than 50-60 g of fat per day.  Remember that you need some fat in your diet. Ask your dietician what your daily goal should be.  Choose nonfat and low-fat healthy foods. Look for the words "nonfat," "low fat," or "fat free."  As a guide, look on the label and  choose foods with less than 3 g of fat per serving. Eat only one serving.  Avoid alcohol.  Do not smoke. If you need help quitting, talk with your health care provider.  Eat small frequent meals instead of three large heavy meals. WHAT FOODS CAN I EAT? Grains Include healthy grains and starches such as potatoes, wheat bread, fiber-rich cereal, and brown rice. Choose whole grain options whenever possible. In adults, whole grains should account for 45-65% of your daily calories.  Fruits and Vegetables Eat plenty of fruits and vegetables. Fresh fruits and vegetables add fiber to your diet. Meats and Other Protein Sources Eat lean meat such as chicken and pork. Trim any fat off of meat before cooking it. Eggs, fish, and beans are other sources of protein. In adults, these foods should account for 10-35% of your daily calories. Dairy Choose low-fat milk and dairy options. Dairy includes fat and protein, as well as calcium.  Fats and Oils Limit high-fat foods such as fried foods, sweets, baked goods, sugary drinks.  Other Creamy sauces and condiments, such as mayonnaise, can add extra fat. Think about whether or not you need to use them, or use smaller amounts or low fat  options. WHAT FOODS ARE NOT RECOMMENDED?  High fat foods, such as:  [employer].  Ice cream.  [place] toast.  Sweet rolls.  Pizza.  Cheese bread.  Foods covered with batter, butter, creamy sauces, or cheese.  Fried foods.  Sugary drinks and desserts.  Foods that cause gas or bloating   This information is not intended to replace advice given to you by your health care provider. Make sure you discuss any questions you have with your health care provider.   Document Released: 10/26/2013 Document Reviewed: 10/26/2013 Elsevier Interactive Patient Education [employer]  .a

## 2018-01-19 NOTE — H&P (View-Only) (Signed)
01/19/2018  Reason for Visit:  Choledocholithiasis  History of Present Illness: Desiree Espinoza is a 62 y.o. female who presents for follow up of choledocholithiasis.  She was hospitalized on 3/10 with choledocholithiasis and had an ERCP on 3/11.  Surgery consult was not obtained at the time and patient was discharged to home on 3/12 with outpatient surgery referral.  Patient presents today for follow up.  She reports that she has been doing well, without any further nausea or vomiting or any worsening pain.  She reports her stools are back to normal color and she denies any further jaundice.  Denies any fevers or chills, chest pain or shortness of breath.  Of note, the patient has multiple sclerosis and comes today on a wheelchair and is wheelchair bound mostly.  Past Medical History: Past Medical History:  Diagnosis Date  . Choledocholithiasis 01/11/2018  . Diabetes mellitus without complication (HCC)   . Hyperlipemia   . MS (multiple sclerosis) (HCC)   . Thyroid disease      Past Surgical History: Past Surgical History:  Procedure Laterality Date  . ENDOSCOPIC RETROGRADE CHOLANGIOPANCREATOGRAPHY (ERCP) WITH PROPOFOL N/A 01/12/2018   Procedure: ENDOSCOPIC RETROGRADE CHOLANGIOPANCREATOGRAPHY (ERCP) WITH PROPOFOL;  Surgeon: Midge Minium, MD;  Location: [facility] ENDOSCOPY;  Service: Endoscopy;  Laterality: N/A;    Home Medications: Prior to Admission medications   Medication Sig Start Date End Date Taking? Authorizing Provider  ACCU-CHEK SMARTVIEW test strip  12/19/17  Yes [provider]  atorvastatin (LIPITOR) 40 MG tablet Take 40 mg by mouth daily. 12/19/17  Yes [provider]  Colloidal Oatmeal (NATURAL OATMEAL BATH TREATMENT EX)  12/11/17  Yes [provider]  glipiZIDE (GLUCOTROL) 10 MG tablet Take 10 mg by mouth 2 (two) times daily. 12/19/17  Yes [provider]    Allergies: No Known Allergies  Social History:  reports that  has never smoked. she  has never used smokeless tobacco. She reports that she does not drink alcohol or use drugs.   Family History: Family History  Problem Relation Age of Onset  . Diabetes Mother   . Throat cancer Father   . Breast cancer Sister   . Breast cancer Sister     Review of Systems: Review of Systems  Constitutional: Negative for chills and fever.  Eyes: Negative for blurred vision.  Respiratory: Negative for shortness of breath.   Cardiovascular: Negative for chest pain.  Gastrointestinal: Negative for abdominal pain, constipation, diarrhea, nausea and vomiting.  Genitourinary: Negative for dysuria.  Musculoskeletal: Negative for myalgias.  Skin: Negative for rash.  Neurological: Negative for dizziness.  Psychiatric/Behavioral: Negative for depression.  All other systems reviewed and are negative.   Physical Exam BP 120/76   Pulse 98   Temp (!) 97.5 F (36.4 C) (Oral)   Ht 5\' 4"  (1.626 m)   BMI 24.89 kg/m  CONSTITUTIONAL: No acute distress HEENT:  Normocephalic, atraumatic, extraocular motion intact. NECK: Trachea is midline, and there is no jugular venous distension.  RESPIRATORY:  Lungs are clear, and breath sounds are equal bilaterally. Normal respiratory effort without pathologic use of accessory muscles. CARDIOVASCULAR: Heart is regular without murmurs, gallops, or rubs. GI: The abdomen is soft, nondistended, with some discomfort to palpation over the right upper quadrant.  Negative Murphy's sign.  MUSCULOSKELETAL:  Unable to fully assess as patient is on a wheelchair. SKIN: Skin turgor is normal. There are no pathologic skin lesions.  NEUROLOGIC:  Unable to fully assess given MS. PSYCH:  Alert and oriented to person,  place and time. Affect is normal.  Laboratory Analysis: Labs 3/12 after ERCP:  Total bilirubin 1.4, AST 57, ALT 144, AP 148.  Imaging: U/S 3/10: IMPRESSION: 1. There is a 12.7 mm filling defect in the common bile duct. There is common bile duct dilatation.  Recommend an MRCP for better evaluation. 2. There is dense shadowing associated with the gallbladder suggesting a gallbladder filled with stones. A positive Murphy's sign is reported. No wall thickening or pericholecystic fluid. Recommend attention on MRCP. 3. Intrahepatic ductal dilatation. Shadowing echogenicity could represent pneumobilia. Recommend attention on MRCP.  Assessment and Plan: This is a 62 y.o. female who presents with recent choledocholithiasis s/p ERCP on 3/11.  I have independently viewed the patient's imaging study and reviewed the patient's laboratory studies.  Patient is now s/p ERCP.  Discussed with the patient that the recommendation after this episode of choledocholithiasis would be to proceed with laparoscopic cholecystectomy.  Discussed with the patient the risk of bleeding, infection, and injury to surrounding structures, and risk to convert to open procedure.  Discussed with the patient as well that without surgery, there would be a risk of this episode happening again, but unfortunately I could not tell her when it would happen or if.  This episode could also turn into gallstone pancreatitis episode as well.  Given that, she has opted to proceed with cholecystectomy.  I would also do an intraoperative cholangiogram to evaluate her biliary anatomy.  She will be scheduled for 02/05/18.  Face-to-face time spent with the patient and care providers was 45 minutes, with more than 50% of the time spent counseling, educating, and coordinating care of the patient.     Howie Ill, MD [facility]

## 2018-01-21 ENCOUNTER — Telehealth: Payer: Self-pay | Admitting: Surgery

## 2018-01-21 NOTE — Telephone Encounter (Signed)
Pt advised of pre op date/time and sx date. Sx: 02/05/18 with Dr Dellis Filbert cholecystectomy with gram.  Pre op: 01/28/18 between 1-5:00pm--phone interview.   Patient made aware to call [phone], between 1-3:00pm the day before surgery, to find out what time to arrive.

## 2018-01-28 ENCOUNTER — Encounter
Admission: RE | Admit: 2018-01-28 | Discharge: 2018-01-28 | Disposition: A | Discharge status: Home / Self Care | Payer: PRIVATE HEALTH INSURANCE | Source: Ambulatory Visit | Attending: Surgery | Admitting: Surgery

## 2018-01-28 ENCOUNTER — Other Ambulatory Visit: Payer: Self-pay

## 2018-01-28 HISTORY — DX: Myoneural disorder, unspecified: G70.9

## 2018-01-28 NOTE — Patient Instructions (Signed)
Your procedure is scheduled on: 02-05-18  Report to Same Day Surgery 2nd floor medical mall [facility] Entrance-take elevator on left to 2nd floor.  Check in with surgery information desk.) To find out your arrival time please call [phone] between 1PM - 3PM on 02-04-18  Remember: Instructions that are not followed completely may result in serious medical risk, up to and including death, or upon the discretion of your surgeon and anesthesiologist your surgery may need to be rescheduled.    _x___ 1. Do not eat food after midnight the night before your procedure. NO GUM OR CANDY AFTER MIDNIGHT.  You may drink clear liquids up to 2 hours before you are scheduled to arrive at the hospital for your procedure.  Do not drink clear liquids within 2 hours of your scheduled arrival to the hospital.  Type 1 and type 2 diabetics should only drink water.     __x__ 2. No Alcohol for 24 hours before or after surgery.   __x__3. No Smoking or e-cigarettes for 24 prior to surgery.  Do not use any chewable tobacco products for at least 6 hour prior to surgery   ____  4. Bring all medications with you on the day of surgery if instructed.    __x__ 5. Notify your doctor if there is any change in your medical condition     (cold, fever, infections).    x___6. On the morning of surgery brush your teeth with toothpaste and water.  You may rinse your mouth with mouth wash if you wish.  Do not swallow any toothpaste or mouthwash.   Do not wear jewelry, make-up, hairpins, clips or nail polish.  Do not wear lotions, powders, or perfumes. You may wear deodorant.  Do not shave 48 hours prior to surgery. Men may shave face and neck.  Do not bring valuables to the hospital.    [facility] is not responsible for any belongings or valuables.               Contacts, dentures or bridgework may not be worn into surgery.  Leave your suitcase in the car. After surgery it may be brought to your room.  For patients  admitted to the hospital, discharge time is determined by your treatment team.  _  Patients discharged the day of surgery will not be allowed to drive home.  You will need someone to drive you home and stay with you the night of your procedure.    Please read over the following fact sheets that you were given:   [facility] Preparing for Surgery and or MRSA Information   ____ Take anti-hypertensive listed below, cardiac, seizure, asthma,anti-reflux and psychiatric medicines. These include:  1. NONE  2.  3.  4.  5.  6.  ____Fleets enema or Magnesium Citrate as directed.   ____ Use CHG Soap or sage wipes as directed on instruction sheet   ____ Use inhalers on the day of surgery and bring to hospital day of surgery  _X___ Stop Metformin 2 days prior to surgery-LAST DOSE ON Monday, April 1ST   ____ Take 1/2 of usual insulin dose the night before surgery and none on the morning surgery.   ____ Follow recommendations from Cardiologist, Pulmonologist or PCP regardingstopping Aspirin, Coumadin, Plavix ,Eliquis, Effient, or Pradaxa, and Pletal.  X____Stop Anti-inflammatories such as Advil, Aleve, Ibuprofen, Motrin, Naproxen, Naprosyn, Goodies powders or aspirin products NOW-OK to take Tylenol .   ____ Stop supplements until after surgery.  ____ Bring C-Pap to the hospital.

## 2018-01-28 NOTE — Pre-Procedure Instructions (Signed)
DID PTS PREOP PHONE INTERVIEW WITH PT AND HER HUSBAND- DR PISCOYA HAS ORDERED LABS AND ANESTHESIA NEEDS AN EKG. PTS HUSBAND STATES THEY ARE HAVING TO USE TRANSPORTATION FOR HER SURGERY ON 02-05-18 AND THAT THERE IS NO WAY THEY CAN COME IN FOR LABS AND EKG. I CALLED ANGIE AT DR PISCOYAS OFFICE AND INFORMED HER OF THIS AND THAT THIS WILL HAVE TO BE DONE AM OF SURGERY

## 2018-02-04 MED ORDER — CEFAZOLIN SODIUM-DEXTROSE 2-4 GM/100ML-% IV SOLN
2.0000 g | INTRAVENOUS | Status: AC
  Administered 2018-02-05: 2 g via INTRAVENOUS

## 2018-02-05 ENCOUNTER — Ambulatory Visit: Payer: Medicare HMO | Admitting: Anesthesiology

## 2018-02-05 ENCOUNTER — Encounter: Payer: Self-pay | Admitting: *Deleted

## 2018-02-05 ENCOUNTER — Encounter
Admission: RE | Disposition: A | Discharge status: Home / Self Care | Payer: Self-pay | Source: Ambulatory Visit | Attending: Surgery

## 2018-02-05 ENCOUNTER — Ambulatory Visit
Admission: RE | Admit: 2018-02-05 | Discharge: 2018-02-05 | Disposition: A | Discharge status: Home / Self Care | Payer: Medicare HMO | Source: Ambulatory Visit | Attending: Surgery | Admitting: Surgery

## 2018-02-05 DIAGNOSIS — Z79899 Other long term (current) drug therapy: Secondary | ICD-10-CM | POA: Diagnosis not present

## 2018-02-05 DIAGNOSIS — G35 Multiple sclerosis: Secondary | ICD-10-CM | POA: Insufficient documentation

## 2018-02-05 DIAGNOSIS — Z993 Dependence on wheelchair: Secondary | ICD-10-CM | POA: Diagnosis not present

## 2018-02-05 DIAGNOSIS — K8012 Calculus of gallbladder with acute and chronic cholecystitis without obstruction: Secondary | ICD-10-CM | POA: Diagnosis not present

## 2018-02-05 DIAGNOSIS — E785 Hyperlipidemia, unspecified: Secondary | ICD-10-CM | POA: Insufficient documentation

## 2018-02-05 DIAGNOSIS — Z7984 Long term (current) use of oral hypoglycemic drugs: Secondary | ICD-10-CM | POA: Insufficient documentation

## 2018-02-05 DIAGNOSIS — E119 Type 2 diabetes mellitus without complications: Secondary | ICD-10-CM | POA: Diagnosis not present

## 2018-02-05 DIAGNOSIS — K8044 Calculus of bile duct with chronic cholecystitis without obstruction: Secondary | ICD-10-CM

## 2018-02-05 DIAGNOSIS — K8050 Calculus of bile duct without cholangitis or cholecystitis without obstruction: Secondary | ICD-10-CM | POA: Diagnosis present

## 2018-02-05 HISTORY — PX: CHOLECYSTECTOMY: SHX55

## 2018-02-05 LAB — CBC WITH DIFFERENTIAL/PLATELET
BASOS ABS: 0.1 10*3/uL (ref 0–0.1)
BASOS PCT: 1 %
EOS ABS: 0.3 10*3/uL (ref 0–0.7)
Eosinophils Relative: 3 %
HCT: 44.9 % (ref 35.0–47.0)
HEMOGLOBIN: 14.6 g/dL (ref 12.0–16.0)
LYMPHS ABS: 2.8 10*3/uL (ref 1.0–3.6)
Lymphocytes Relative: 31 %
MCH: 29.1 pg (ref 26.0–34.0)
MCHC: 32.6 g/dL (ref 32.0–36.0)
MCV: 89.2 fL (ref 80.0–100.0)
Monocytes Absolute: 0.6 10*3/uL (ref 0.2–0.9)
Monocytes Relative: 6 %
Neutro Abs: 5.2 10*3/uL (ref 1.4–6.5)
Neutrophils Relative %: 59 %
Platelets: 396 10*3/uL (ref 150–440)
RBC: 5.03 MIL/uL (ref 3.80–5.20)
RDW: 14.8 % — ABNORMAL HIGH (ref 11.5–14.5)
WBC: 8.9 10*3/uL (ref 3.6–11.0)

## 2018-02-05 LAB — COMPREHENSIVE METABOLIC PANEL
ALBUMIN: 4 g/dL (ref 3.5–5.0)
ALK PHOS: 92 U/L (ref 38–126)
ALT: 33 U/L (ref 14–54)
AST: 26 U/L (ref 15–41)
Anion gap: 7 (ref 5–15)
BUN: 9 mg/dL (ref 6–20)
CALCIUM: 9.1 mg/dL (ref 8.9–10.3)
CO2: 29 mmol/L (ref 22–32)
CREATININE: 0.59 mg/dL (ref 0.44–1.00)
Chloride: 105 mmol/L (ref 101–111)
GFR calc Af Amer: >60 mL/min (ref >60)
GFR calc non Af Amer: >60 mL/min (ref >60)
GLUCOSE: 159 mg/dL — AB (ref 65–99)
Potassium: 4.2 mmol/L (ref 3.5–5.1)
SODIUM: 141 mmol/L (ref 135–145)
Total Bilirubin: 0.9 mg/dL (ref 0.3–1.2)
Total Protein: 8.3 g/dL — ABNORMAL HIGH (ref 6.5–8.1)

## 2018-02-05 LAB — PROTIME-INR
INR: 1.04
Prothrombin Time: 13.5 seconds (ref 11.4–15.2)

## 2018-02-05 LAB — GLUCOSE, CAPILLARY
Glucose-Capillary: 142 mg/dL — ABNORMAL HIGH (ref 65–99)
Glucose-Capillary: 171 mg/dL — ABNORMAL HIGH (ref 65–99)

## 2018-02-05 SURGERY — LAPAROSCOPIC CHOLECYSTECTOMY WITH INTRAOPERATIVE CHOLANGIOGRAM
Anesthesia: General

## 2018-02-05 MED ORDER — FENTANYL CITRATE (PF) 100 MCG/2ML IJ SOLN
INTRAMUSCULAR | Status: DC | PRN
  Administered 2018-02-05: 50 ug via INTRAVENOUS
  Administered 2018-02-05: 100 ug via INTRAVENOUS
  Administered 2018-02-05: 50 ug via INTRAVENOUS

## 2018-02-05 MED ORDER — FENTANYL CITRATE (PF) 250 MCG/5ML IJ SOLN
INTRAMUSCULAR | Status: AC
  Filled 2018-02-05: qty 5

## 2018-02-05 MED ORDER — BUPIVACAINE-EPINEPHRINE (PF) 0.25% -1:200000 IJ SOLN
INTRAMUSCULAR | Status: DC | PRN
Start: 2018-02-05 — End: 2018-02-05
  Administered 2018-02-05: 30 mL via PERINEURAL

## 2018-02-05 MED ORDER — HYDROCODONE-ACETAMINOPHEN 7.5-325 MG PO TABS
1.0000 | ORAL_TABLET | Freq: Once | ORAL | Status: DC | PRN
  Filled 2018-02-05: qty 1

## 2018-02-05 MED ORDER — SUCCINYLCHOLINE CHLORIDE 20 MG/ML IJ SOLN
INTRAMUSCULAR | Status: AC
  Filled 2018-02-05: qty 1

## 2018-02-05 MED ORDER — MIDAZOLAM HCL 5 MG/5ML IJ SOLN
INTRAMUSCULAR | Status: DC | PRN
  Administered 2018-02-05: 2 mg via INTRAVENOUS

## 2018-02-05 MED ORDER — FAMOTIDINE 20 MG PO TABS
20.0000 mg | ORAL_TABLET | Freq: Once | ORAL | Status: AC
  Administered 2018-02-05: 20 mg via ORAL

## 2018-02-05 MED ORDER — IBUPROFEN 600 MG PO TABS: 600.0000 mg | ORAL_TABLET | Freq: Three times a day (TID) | ORAL | 0 refills | Status: DC | PRN

## 2018-02-05 MED ORDER — ACETAMINOPHEN 500 MG PO TABS
ORAL_TABLET | ORAL | Status: AC
  Administered 2018-02-05: 1000 mg via ORAL
  Filled 2018-02-05: qty 2

## 2018-02-05 MED ORDER — ROCURONIUM BROMIDE 50 MG/5ML IV SOLN
INTRAVENOUS | Status: AC
Start: 2018-02-05 — End: 2018-02-05
  Filled 2018-02-05: qty 1

## 2018-02-05 MED ORDER — GABAPENTIN 300 MG PO CAPS
300.0000 mg | ORAL_CAPSULE | ORAL | Status: AC
  Administered 2018-02-05: 300 mg via ORAL

## 2018-02-05 MED ORDER — ONDANSETRON HCL 4 MG/2ML IJ SOLN
INTRAMUSCULAR | Status: DC | PRN
  Administered 2018-02-05: 4 mg via INTRAVENOUS

## 2018-02-05 MED ORDER — LIDOCAINE HCL (PF) 2 % IJ SOLN
INTRAMUSCULAR | Status: AC
  Filled 2018-02-05: qty 10

## 2018-02-05 MED ORDER — DEXAMETHASONE SODIUM PHOSPHATE 10 MG/ML IJ SOLN
INTRAMUSCULAR | Status: DC | PRN
  Administered 2018-02-05: 10 mg via INTRAVENOUS

## 2018-02-05 MED ORDER — PHENYLEPHRINE HCL 10 MG/ML IJ SOLN
INTRAMUSCULAR | Status: DC | PRN
  Administered 2018-02-05: 80 ug via INTRAVENOUS

## 2018-02-05 MED ORDER — LACTATED RINGERS IV SOLN
INTRAVENOUS | Status: DC | PRN
  Administered 2018-02-05: 13:00:00 via INTRAVENOUS

## 2018-02-05 MED ORDER — MIDAZOLAM HCL 2 MG/2ML IJ SOLN
INTRAMUSCULAR | Status: AC
  Filled 2018-02-05: qty 2

## 2018-02-05 MED ORDER — SUGAMMADEX SODIUM 200 MG/2ML IV SOLN
INTRAVENOUS | Status: AC
  Filled 2018-02-05: qty 2

## 2018-02-05 MED ORDER — ARTIFICIAL TEARS OPHTHALMIC OINT
TOPICAL_OINTMENT | OPHTHALMIC | Status: AC
  Filled 2018-02-05: qty 3.5

## 2018-02-05 MED ORDER — LIDOCAINE HCL (CARDIAC) 20 MG/ML IV SOLN
INTRAVENOUS | Status: DC | PRN
  Administered 2018-02-05: 60 mg via INTRAVENOUS

## 2018-02-05 MED ORDER — PROPOFOL 10 MG/ML IV BOLUS
INTRAVENOUS | Status: AC
Start: 2018-02-05 — End: 2018-02-05
  Filled 2018-02-05: qty 20

## 2018-02-05 MED ORDER — GABAPENTIN 300 MG PO CAPS
ORAL_CAPSULE | ORAL | Status: AC
  Administered 2018-02-05: 300 mg via ORAL
  Filled 2018-02-05: qty 1

## 2018-02-05 MED ORDER — ONDANSETRON HCL 4 MG/2ML IJ SOLN
INTRAMUSCULAR | Status: AC
  Filled 2018-02-05: qty 2

## 2018-02-05 MED ORDER — MEPERIDINE HCL 50 MG/ML IJ SOLN: 6.2500 mg | INTRAMUSCULAR | Status: DC | PRN

## 2018-02-05 MED ORDER — SODIUM CHLORIDE 0.9 % IV SOLN
INTRAVENOUS | Status: DC
  Administered 2018-02-05: 10:00:00 via INTRAVENOUS

## 2018-02-05 MED ORDER — ACETAMINOPHEN 500 MG PO TABS
1000.0000 mg | ORAL_TABLET | ORAL | Status: AC
  Administered 2018-02-05: 1000 mg via ORAL

## 2018-02-05 MED ORDER — ROCURONIUM BROMIDE 100 MG/10ML IV SOLN
INTRAVENOUS | Status: DC | PRN
  Administered 2018-02-05: 30 mg via INTRAVENOUS

## 2018-02-05 MED ORDER — OXYCODONE HCL 5 MG PO TABS: 5.0000 mg | ORAL_TABLET | Freq: Four times a day (QID) | ORAL | 0 refills | Status: DC | PRN

## 2018-02-05 MED ORDER — SUGAMMADEX SODIUM 200 MG/2ML IV SOLN
INTRAVENOUS | Status: DC | PRN
  Administered 2018-02-05: 150 mg via INTRAVENOUS

## 2018-02-05 MED ORDER — DEXAMETHASONE SODIUM PHOSPHATE 10 MG/ML IJ SOLN
INTRAMUSCULAR | Status: AC
Start: 2018-02-05 — End: 2018-02-05
  Filled 2018-02-05: qty 1

## 2018-02-05 MED ORDER — PROMETHAZINE HCL 25 MG/ML IJ SOLN: 6.2500 mg | INTRAMUSCULAR | Status: DC | PRN

## 2018-02-05 MED ORDER — PROPOFOL 10 MG/ML IV BOLUS
INTRAVENOUS | Status: DC | PRN
  Administered 2018-02-05: 50 mg via INTRAVENOUS

## 2018-02-05 MED ORDER — CHLORHEXIDINE GLUCONATE CLOTH 2 % EX PADS: 6.0000 | MEDICATED_PAD | Freq: Once | CUTANEOUS | Status: DC

## 2018-02-05 MED ORDER — HYDROMORPHONE HCL 1 MG/ML IJ SOLN: 0.2500 mg | INTRAMUSCULAR | Status: DC | PRN

## 2018-02-05 MED ORDER — BUPIVACAINE-EPINEPHRINE (PF) 0.25% -1:200000 IJ SOLN
INTRAMUSCULAR | Status: AC
  Filled 2018-02-05: qty 30

## 2018-02-05 MED ORDER — FAMOTIDINE 20 MG PO TABS
ORAL_TABLET | ORAL | Status: AC
  Administered 2018-02-05: 20 mg via ORAL
  Filled 2018-02-05: qty 1

## 2018-02-05 MED ORDER — CEFAZOLIN SODIUM-DEXTROSE 2-4 GM/100ML-% IV SOLN
INTRAVENOUS | Status: AC
  Filled 2018-02-05: qty 100

## 2018-02-05 SURGICAL SUPPLY — 44 items
APPLIER CLIP 5 13 M/L LIGAMAX5 (MISCELLANEOUS) ×3
BLADE SURG 15 STRL LF DISP TIS (BLADE) ×1 IMPLANT
BLADE SURG 15 STRL SS (BLADE) ×2
CANISTER SUCT 1200ML W/VALVE (MISCELLANEOUS) ×3 IMPLANT
CATH CHOLANGI 4FR 420404F (CATHETERS) IMPLANT
CHLORAPREP W/TINT 26ML (MISCELLANEOUS) ×3 IMPLANT
CLIP APPLIE 5 13 M/L LIGAMAX5 (MISCELLANEOUS) ×1 IMPLANT
CONRAY 60ML FOR OR (MISCELLANEOUS) ×3 IMPLANT
DERMABOND ADVANCED (GAUZE/BANDAGES/DRESSINGS) ×2
DERMABOND ADVANCED .7 DNX12 (GAUZE/BANDAGES/DRESSINGS) ×1 IMPLANT
DRAPE C-ARM XRAY 36X54 (DRAPES) IMPLANT
ELECT REM PT RETURN 9FT ADLT (ELECTROSURGICAL) ×3
ELECTRODE REM PT RTRN 9FT ADLT (ELECTROSURGICAL) ×1 IMPLANT
ENDOLOOP SUT PDS II  0 18 (SUTURE) ×4
ENDOLOOP SUT PDS II 0 18 (SUTURE) ×2 IMPLANT
FILTER LAP SMOKE EVAC STRL (MISCELLANEOUS) ×3 IMPLANT
GLOVE SURG SYN 7.0 (GLOVE) ×3 IMPLANT
GLOVE SURG SYN 7.5  E (GLOVE) ×2
GLOVE SURG SYN 7.5 E (GLOVE) ×1 IMPLANT
GOWN STRL REUS W/ TWL LRG LVL3 (GOWN DISPOSABLE) ×3 IMPLANT
GOWN STRL REUS W/TWL LRG LVL3 (GOWN DISPOSABLE) ×6
IRRIGATION STRYKERFLOW (MISCELLANEOUS) ×1 IMPLANT
IRRIGATOR STRYKERFLOW (MISCELLANEOUS) ×3
IV CATH ANGIO 12GX3 LT BLUE (NEEDLE) ×3 IMPLANT
IV NS 1000ML (IV SOLUTION)
IV NS 1000ML BAXH (IV SOLUTION) IMPLANT
JACKSON PRATT 10 (INSTRUMENTS) IMPLANT
L-HOOK LAP DISP 36CM (ELECTROSURGICAL) ×3
LABEL OR SOLS (LABEL) ×3 IMPLANT
LHOOK LAP DISP 36CM (ELECTROSURGICAL) ×1 IMPLANT
NEEDLE HYPO 22GX1.5 SAFETY (NEEDLE) ×3 IMPLANT
PACK LAP CHOLECYSTECTOMY (MISCELLANEOUS) ×3 IMPLANT
PENCIL ELECTRO HAND CTR (MISCELLANEOUS) ×3 IMPLANT
POUCH SPECIMEN RETRIEVAL 10MM (ENDOMECHANICALS) ×3 IMPLANT
SCISSORS METZENBAUM CVD 33 (INSTRUMENTS) ×3 IMPLANT
SLEEVE ADV FIXATION 5X100MM (TROCAR) ×9 IMPLANT
SPONGE VERSALON 4X4 4PLY (MISCELLANEOUS) IMPLANT
SUT MNCRL 4-0 (SUTURE) ×2
SUT MNCRL 4-0 27XMFL (SUTURE) ×1
SUT VICRYL 0 AB UR-6 (SUTURE) ×3 IMPLANT
SUTURE MNCRL 4-0 27XMF (SUTURE) ×1 IMPLANT
TROCAR BALLN GELPORT 12X130M (ENDOMECHANICALS) ×3 IMPLANT
TROCAR Z-THREAD OPTICAL 5X100M (TROCAR) ×3 IMPLANT
TUBING INSUFFLATION (TUBING) ×3 IMPLANT

## 2018-02-05 NOTE — Anesthesia Procedure Notes (Signed)
Procedure Name: Intubation Date/Time: 02/05/2018 11:51 AM Performed by: Dionne Bucy, CRNA Pre-anesthesia Checklist: Patient identified, Patient being monitored, Timeout performed, Emergency Drugs available and Suction available Patient Re-evaluated:Patient Re-evaluated prior to induction Oxygen Delivery Method: Circle system utilized Preoxygenation: Pre-oxygenation with 100% oxygen Induction Type: IV induction Ventilation: Mask ventilation without difficulty Laryngoscope Size: Mac and 3 Grade View: Grade I Tube type: Oral Tube size: 7.0 mm Number of attempts: 1 Airway Equipment and Method: Stylet Placement Confirmation: ETT inserted through vocal cords under direct vision,  positive ETCO2 and breath sounds checked- equal and bilateral Secured at: 19 cm Tube secured with: Tape Dental Injury: Teeth and Oropharynx as per pre-operative assessment

## 2018-02-05 NOTE — Discharge Instructions (Signed)

## 2018-02-05 NOTE — Anesthesia Post-op Follow-up Note (Signed)
Anesthesia QCDR form completed.        

## 2018-02-05 NOTE — OR Nursing (Signed)
Pre-op Bladder scan 314 ,MD Dr. Aleen Campi notified.

## 2018-02-05 NOTE — Interval H&P Note (Signed)
History and Physical Interval Note:  02/05/2018 8:55 AM  Desiree Espinoza  has presented today for surgery, with the diagnosis of choledocholithiasis  The various methods of treatment have been discussed with the patient and family. After consideration of risks, benefits and other options for treatment, the patient has consented to  Procedure(s): LAPAROSCOPIC CHOLECYSTECTOMY WITH INTRAOPERATIVE CHOLANGIOGRAM (N/A) as a surgical intervention .  The patient's history has been reviewed, patient examined, no change in status, stable for surgery.  I have reviewed the patient's chart and labs.  Questions were answered to the patient's satisfaction.     Yajayra Feldt

## 2018-02-05 NOTE — Transfer of Care (Signed)
Immediate Anesthesia Transfer of Care Note  Patient: Desiree Espinoza  Procedure(s) Performed: LAPAROSCOPIC CHOLECYSTECTOMY (N/A )  Patient Location: PACU  Anesthesia Type:General  Level of Consciousness: awake, alert  and oriented  Airway & Oxygen Therapy: Patient Spontanous Breathing and Patient connected to face mask oxygen  Post-op Assessment: Report given to RN and Post -op Vital signs reviewed and stable  Post vital signs: Reviewed and stable  Last Vitals:  Vitals Value Taken Time  BP 140/80 02/05/2018  2:10 PM  Temp    Pulse 97 02/05/2018  2:11 PM  Resp 20 02/05/2018  2:11 PM  SpO2 97 % 02/05/2018  2:11 PM  Vitals shown include unvalidated device data.  Last Pain:  Vitals:   02/05/18 0859  TempSrc: Temporal  PainSc: 0-No pain         Complications: No apparent anesthesia complications

## 2018-02-05 NOTE — OR Nursing (Signed)
Discharge instructions discussed with pt and husband. Both voice understanding. Waited on transportation for 2 hours.

## 2018-02-05 NOTE — Anesthesia Preprocedure Evaluation (Signed)
Anesthesia Evaluation  Patient identified by MRN, date of birth, ID band Patient awake    Reviewed: Allergy & Precautions, H&P , NPO status , reviewed documented beta blocker date and time   Airway Mallampati: II  TM Distance: >3 FB Neck ROM: full    Dental  (+) Chipped, Missing   Pulmonary neg pulmonary ROS,    Pulmonary exam normal        Cardiovascular negative cardio ROS Normal cardiovascular exam     Neuro/Psych  Neuromuscular disease negative psych ROS   GI/Hepatic GERD  Controlled,  Endo/Other  diabetes  Renal/GU negative Renal ROS Bladder dysfunction  Incontinent    Musculoskeletal   Abdominal   Peds  Hematology negative hematology ROS (+)   Anesthesia Other Findings Past Medical History: No date: Diabetes mellitus without complication (HCC) No date: Hyperlipemia No date: MS (multiple sclerosis) (HCC) No date: Thyroid disease    Wheelchair bound per patient...weakness of arms and legs... pulmonart  Status stable  Reproductive/Obstetrics negative OB ROS                             Anesthesia Physical Anesthesia Plan  ASA: III  Anesthesia Plan: General ETT   Post-op Pain Management:    Induction:   PONV Risk Score and Plan: 4 or greater and Ondansetron, Midazolam, Dexamethasone and Promethazine  Airway Management Planned:   Additional Equipment:   Intra-op Plan:   Post-operative Plan:   Informed Consent: I have reviewed the patients History and Physical, chart, labs and discussed the procedure including the risks, benefits and alternatives for the proposed anesthesia with the patient or authorized representative who has indicated his/her understanding and acceptance.   Dental Advisory Given  Plan Discussed with: CRNA  Anesthesia Plan Comments:         Anesthesia Quick Evaluation

## 2018-02-05 NOTE — OR Nursing (Signed)
Spoke with [facility] OR nurse , aware of bladder scan result , in and out cath to be done in OR prior to surgery.

## 2018-02-05 NOTE — Op Note (Addendum)
  Procedure Date:  02/05/2018  Pre-operative Diagnosis:  Choledocholithiasis  Post-operative Diagnosis:  Choledocholithiasis and chronic cholecystitis  Procedure:  Laparoscopic cholecystectomy  Surgeon:  Howie Ill, MD  Anesthesia:  General endotracheal  Estimated Blood Loss:  20 ml  Specimens:  gallbladder  Complications:  None  Indications for Procedure:  This is a 62 y.o. female who presents with abdominal pain and workup revealing choledocholithiasis, s/p ERCP last month.  The benefits, complications, treatment options, and expected outcomes were discussed with the patient. The risks of bleeding, infection, recurrence of symptoms, failure to resolve symptoms, bile duct damage, bile duct leak, retained common bile duct stone, bowel injury, and need for further procedures were all discussed with the patient and she was willing to proceed.  Description of Procedure: The patient was correctly identified in the preoperative area and brought into the operating room.  The patient was placed supine with VTE prophylaxis in place.  Appropriate time-outs were performed.  Anesthesia was induced and the patient was intubated.  Appropriate antibiotics were infused.  The abdomen was prepped and draped in a sterile fashion. An infraumbilical incision was made. A cutdown technique was used to enter the abdominal cavity without injury, and a Hasson trocar was inserted.  There was significant bleeding fromt he fascial edge which was controlled with cautery.  Pneumoperitoneum was obtained with appropriate opening pressures.  A 5-mm port was placed in the subxiphoid area and two 5-mm ports were placed in the right upper quadrant under direct visualization.  The gallbladder was identified and found to be very contracted and hard.  There were some adhesions from the liver to the abdominal wall and these were taken down using cautery.  The gallbladder was hard enough that could not be grasped and the scar  on the liver edge was used to retract the liver and gallbladder.  Adhesions were lysed bluntly and with electrocautery. The infundibulum was grasped and retracted laterally, exposing the peritoneum overlying the gallbladder.  This was incised with electrocautery and extended on either side of the gallbladder.  The cystic duct and cystic artery were clearly identified and bluntly dissected.  The cystic duct was quite dilated and clips were not long enough to go across the duct.  An #1 PDS endoloop was used to ligate the cystic duct stump.  The cystic artery was clipped twice proximally and once distally, cutting in between.  The gallbladder was taken from the gallbladder fossa in a retrograde fashion with electrocautery. The gallbladder was placed in an Endocatch bag and brought out via the umbilical incision. The liver bed was inspected and any bleeding was controlled with electrocautery. The right upper quadrant was then inspected again revealing intact clips and endoloop, no bleeding, and no ductal injury.  The area was thoroughly irrigated.  The 5 mm ports were removed under direct visualization and the Hasson trocar was removed.  The fascial opening was closed using 0 vicryl suture.  Local anesthetic was infused in all incisions and the incisions were closed with 4-0 Monocryl.  The wounds were cleaned and sealed with DermaBond.  The patient was emerged from anesthesia and extubated and brought to the recovery room for further management.  The patient tolerated the procedure well and all counts were correct at the end of the case.   Howie Ill, MD

## 2018-02-05 NOTE — OR Nursing (Signed)
Performed a straight cath (in and out) in the OR. Sterile technique was performed along with peri care. 575cc of urine was emptied from the patients bladder. Surgeon and anesthesia made aware.

## 2018-02-06 ENCOUNTER — Encounter: Payer: Self-pay | Admitting: Surgery

## 2018-02-06 NOTE — Anesthesia Postprocedure Evaluation (Signed)
Anesthesia Post Note  Patient: Desiree Espinoza  Procedure(s) Performed: LAPAROSCOPIC CHOLECYSTECTOMY (N/A )  Patient location during evaluation: PACU Anesthesia Type: General Level of consciousness: awake and alert Pain management: pain level controlled Vital Signs Assessment: post-procedure vital signs reviewed and stable Respiratory status: spontaneous breathing, nonlabored ventilation and respiratory function stable Cardiovascular status: blood pressure returned to baseline and stable Postop Assessment: no apparent nausea or vomiting Anesthetic complications: no     Last Vitals:  Vitals:   02/05/18 1537 02/05/18 1600  BP: (!) 140/91 (!) 145/87  Pulse:    Resp:    Temp:    SpO2: 95% 96%    Last Pain:  Vitals:   02/05/18 1530  TempSrc:   PainSc: 0-No pain                 Christia Reading

## 2018-02-09 LAB — SURGICAL PATHOLOGY

## 2018-02-19 ENCOUNTER — Encounter: Payer: Self-pay | Admitting: Surgery

## 2018-02-19 ENCOUNTER — Ambulatory Visit (INDEPENDENT_AMBULATORY_CARE_PROVIDER_SITE_OTHER): Payer: Medicare HMO | Admitting: Surgery

## 2018-02-19 VITALS — BP 142/88 | Temp 98.3°F

## 2018-02-19 DIAGNOSIS — K8050 Calculus of bile duct without cholangitis or cholecystitis without obstruction: Secondary | ICD-10-CM

## 2018-02-19 MED ORDER — POLYETHYLENE GLYCOL POWD: 17.0000 g | Freq: Every day | 5 refills | Status: DC

## 2018-02-19 NOTE — Patient Instructions (Signed)
Please start taking Miralax 17 G daily to help with your constipation.   We will see you back next week to make sure that your pain is under control and that your constipation had resolved.

## 2018-02-19 NOTE — Progress Notes (Signed)
Outpatient postop visit  02/19/2018  Desiree Espinoza is an 62 y.o. female.    Procedure:lap chole  CC: Abdominal pain  HPI: Patient status post laparoscopic cholecystectomy for acute and chronic cholecystitis with mural abscess.  This was confirmed on pathology.  Patient describes abdominal pain but is eating and her appetite is improving she is eating several times a day.  She has been constipated but had a hard bowel movement yesterday with the aid of some Dulcolax.  She has no fevers or chills.  Medications reviewed.    Physical Exam:  BP (!) 142/88   Temp 98.3 F (36.8 C) (Oral)     PE: No icterus no jaundice.  Patient is in a wheelchair.  Vital signs are reviewed and afebrile.  Abdomen is soft nondistended nontympanitic and nontender wounds are clean with Dermabond still in place no erythema or drainage.    Assessment/Plan:  Pathology is reviewed.  See above.  I believe the patient's pain is the expected amount following what was seen on pathology has a intramural abscess of the gallbladder.  She has no signs of peritonitis etc. and is eating well without fevers.  Constipation may be a contributing factor and I discussed MiraLAX with her.  She will follow-up with Dr. Aleen Campi next week  Lattie Haw, MD, FACS

## 2018-02-25 ENCOUNTER — Encounter: Payer: Self-pay | Admitting: Surgery

## 2018-03-05 ENCOUNTER — Telehealth: Payer: Self-pay | Admitting: Surgery

## 2018-03-05 ENCOUNTER — Encounter: Payer: Self-pay | Admitting: Surgery

## 2018-03-05 NOTE — Telephone Encounter (Signed)
Patient no showed appointment with Dr. Aleen Campi on 02/25/18. I have mailed a letter for the patient to contact our office and r/s her no showed appointment.

## 2018-10-28 ENCOUNTER — Emergency Department: Payer: Medicare HMO

## 2018-10-28 ENCOUNTER — Encounter: Payer: Self-pay | Admitting: Emergency Medicine

## 2018-10-28 ENCOUNTER — Other Ambulatory Visit: Payer: Self-pay

## 2018-10-28 ENCOUNTER — Inpatient Hospital Stay
Admission: EM | Admit: 2018-10-28 | Discharge: 2018-10-31 | DRG: 871 | Disposition: A | Discharge status: Skilled Nursing Facility | Payer: Medicare HMO | Attending: Internal Medicine | Admitting: Internal Medicine

## 2018-10-28 DIAGNOSIS — Z803 Family history of malignant neoplasm of breast: Secondary | ICD-10-CM | POA: Diagnosis not present

## 2018-10-28 DIAGNOSIS — N390 Urinary tract infection, site not specified: Secondary | ICD-10-CM | POA: Diagnosis present

## 2018-10-28 DIAGNOSIS — N3001 Acute cystitis with hematuria: Secondary | ICD-10-CM | POA: Diagnosis not present

## 2018-10-28 DIAGNOSIS — R6520 Severe sepsis without septic shock: Secondary | ICD-10-CM | POA: Diagnosis present

## 2018-10-28 DIAGNOSIS — N136 Pyonephrosis: Secondary | ICD-10-CM | POA: Diagnosis present

## 2018-10-28 DIAGNOSIS — E860 Dehydration: Secondary | ICD-10-CM | POA: Diagnosis present

## 2018-10-28 DIAGNOSIS — Z79899 Other long term (current) drug therapy: Secondary | ICD-10-CM

## 2018-10-28 DIAGNOSIS — E785 Hyperlipidemia, unspecified: Secondary | ICD-10-CM | POA: Diagnosis present

## 2018-10-28 DIAGNOSIS — B961 Klebsiella pneumoniae [K. pneumoniae] as the cause of diseases classified elsewhere: Secondary | ICD-10-CM | POA: Diagnosis present

## 2018-10-28 DIAGNOSIS — G35 Multiple sclerosis: Secondary | ICD-10-CM | POA: Diagnosis present

## 2018-10-28 DIAGNOSIS — Z7401 Bed confinement status: Secondary | ICD-10-CM

## 2018-10-28 DIAGNOSIS — R4182 Altered mental status, unspecified: Secondary | ICD-10-CM

## 2018-10-28 DIAGNOSIS — A419 Sepsis, unspecified organism: Secondary | ICD-10-CM | POA: Diagnosis not present

## 2018-10-28 DIAGNOSIS — Z791 Long term (current) use of non-steroidal anti-inflammatories (NSAID): Secondary | ICD-10-CM

## 2018-10-28 DIAGNOSIS — Z7984 Long term (current) use of oral hypoglycemic drugs: Secondary | ICD-10-CM | POA: Diagnosis not present

## 2018-10-28 DIAGNOSIS — Z808 Family history of malignant neoplasm of other organs or systems: Secondary | ICD-10-CM

## 2018-10-28 DIAGNOSIS — G9341 Metabolic encephalopathy: Secondary | ICD-10-CM | POA: Diagnosis present

## 2018-10-28 DIAGNOSIS — Z9049 Acquired absence of other specified parts of digestive tract: Secondary | ICD-10-CM

## 2018-10-28 DIAGNOSIS — Z833 Family history of diabetes mellitus: Secondary | ICD-10-CM

## 2018-10-28 DIAGNOSIS — R338 Other retention of urine: Secondary | ICD-10-CM

## 2018-10-28 LAB — CBC WITH DIFFERENTIAL/PLATELET
Abs Immature Granulocytes: 0.06 10*3/uL (ref 0.00–0.07)
BASOS PCT: 1 %
Basophils Absolute: 0.1 10*3/uL (ref 0.0–0.1)
EOS ABS: 0 10*3/uL (ref 0.0–0.5)
EOS PCT: 0 %
HCT: 47.3 % — ABNORMAL HIGH (ref 36.0–46.0)
Hemoglobin: 15 g/dL (ref 12.0–15.0)
IMMATURE GRANULOCYTES: 1 %
Lymphocytes Relative: 19 %
Lymphs Abs: 2.3 10*3/uL (ref 0.7–4.0)
MCH: 28.4 pg (ref 26.0–34.0)
MCHC: 31.7 g/dL (ref 30.0–36.0)
MCV: 89.4 fL (ref 80.0–100.0)
MONO ABS: 0.8 10*3/uL (ref 0.1–1.0)
MONOS PCT: 7 %
NEUTROS PCT: 72 %
Neutro Abs: 9.1 10*3/uL — ABNORMAL HIGH (ref 1.7–7.7)
PLATELETS: 361 10*3/uL (ref 150–400)
RBC: 5.29 MIL/uL — ABNORMAL HIGH (ref 3.87–5.11)
RDW: 13.5 % (ref 11.5–15.5)
WBC: 12.3 10*3/uL — ABNORMAL HIGH (ref 4.0–10.5)
nRBC: 0 % (ref 0.0–0.2)

## 2018-10-28 LAB — COMPREHENSIVE METABOLIC PANEL
ALT: 30 U/L (ref 0–44)
AST: 30 U/L (ref 15–41)
Albumin: 3.8 g/dL (ref 3.5–5.0)
Alkaline Phosphatase: 78 U/L (ref 38–126)
Anion gap: 10 (ref 5–15)
BUN: 16 mg/dL (ref 6–20)
CHLORIDE: 102 mmol/L (ref 98–111)
CO2: 23 mmol/L (ref 22–32)
Calcium: 9.5 mg/dL (ref 8.9–10.3)
Creatinine, Ser: 0.95 mg/dL (ref 0.44–1.00)
GFR calc non Af Amer: >60 mL/min (ref >60)
Glucose, Bld: 258 mg/dL — ABNORMAL HIGH (ref 70–99)
POTASSIUM: 4.3 mmol/L (ref 3.5–5.1)
Sodium: 135 mmol/L (ref 135–145)
Total Bilirubin: 1.7 mg/dL — ABNORMAL HIGH (ref 0.3–1.2)
Total Protein: 8 g/dL (ref 6.5–8.1)

## 2018-10-28 LAB — URINALYSIS, COMPLETE (UACMP) WITH MICROSCOPIC
BILIRUBIN URINE: NEGATIVE
Ketones, ur: NEGATIVE mg/dL
Nitrite: NEGATIVE
PROTEIN: 100 mg/dL — AB
SPECIFIC GRAVITY, URINE: 1.024 (ref 1.005–1.030)
Squamous Epithelial / LPF: NONE SEEN (ref 0–5)
pH: 5 (ref 5.0–8.0)

## 2018-10-28 LAB — TSH: TSH: 1.155 u[IU]/mL (ref 0.350–4.500)

## 2018-10-28 LAB — PROCALCITONIN: Procalcitonin: 0.45 ng/mL

## 2018-10-28 LAB — T4, FREE: Free T4: 0.79 ng/dL — ABNORMAL LOW (ref 0.82–1.77)

## 2018-10-28 LAB — LACTIC ACID, PLASMA: Lactic Acid, Venous: 1.5 mmol/L (ref 0.5–1.9)

## 2018-10-28 LAB — TROPONIN I: Troponin I: <0.03 ng/mL

## 2018-10-28 MED ORDER — SODIUM CHLORIDE 0.9 % IV BOLUS
1000.0000 mL | Freq: Once | INTRAVENOUS | Status: AC
  Administered 2018-10-28: 1000 mL via INTRAVENOUS

## 2018-10-28 MED ORDER — METFORMIN HCL 500 MG PO TABS
1000.0000 mg | ORAL_TABLET | Freq: Two times a day (BID) | ORAL | Status: DC
  Administered 2018-10-28 – 2018-10-31 (×6): 1000 mg via ORAL
  Filled 2018-10-28 (×6): qty 2

## 2018-10-28 MED ORDER — ACETAMINOPHEN 650 MG RE SUPP: 650.0000 mg | Freq: Four times a day (QID) | RECTAL | Status: DC | PRN

## 2018-10-28 MED ORDER — SODIUM CHLORIDE 0.9 % IV SOLN
1.0000 g | INTRAVENOUS | Status: DC
  Administered 2018-10-29: 1 g via INTRAVENOUS
  Filled 2018-10-28: qty 1
  Filled 2018-10-28: qty 10

## 2018-10-28 MED ORDER — SODIUM CHLORIDE 0.9 % IV SOLN
Freq: Once | INTRAVENOUS | Status: AC
  Administered 2018-10-28: 14:00:00 via INTRAVENOUS

## 2018-10-28 MED ORDER — BISACODYL 5 MG PO TBEC
5.0000 mg | DELAYED_RELEASE_TABLET | Freq: Every day | ORAL | Status: DC | PRN
  Filled 2018-10-28: qty 1

## 2018-10-28 MED ORDER — IOPAMIDOL (ISOVUE-300) INJECTION 61%
100.0000 mL | Freq: Once | INTRAVENOUS | Status: AC | PRN
  Administered 2018-10-28: 100 mL via INTRAVENOUS

## 2018-10-28 MED ORDER — ONDANSETRON HCL 4 MG PO TABS: 4.0000 mg | ORAL_TABLET | Freq: Four times a day (QID) | ORAL | Status: DC | PRN

## 2018-10-28 MED ORDER — ACETAMINOPHEN 325 MG PO TABS: 650.0000 mg | ORAL_TABLET | Freq: Four times a day (QID) | ORAL | Status: DC | PRN

## 2018-10-28 MED ORDER — ENOXAPARIN SODIUM 40 MG/0.4ML ~~LOC~~ SOLN
40.0000 mg | SUBCUTANEOUS | Status: DC
  Administered 2018-10-28 – 2018-10-30 (×3): 40 mg via SUBCUTANEOUS
  Filled 2018-10-28 (×3): qty 0.4

## 2018-10-28 MED ORDER — SODIUM CHLORIDE 0.9 % IV SOLN
2.0000 g | Freq: Once | INTRAVENOUS | Status: AC
  Administered 2018-10-28: 2 g via INTRAVENOUS
  Filled 2018-10-28: qty 20

## 2018-10-28 MED ORDER — POLYETHYLENE GLYCOL 3350 17 G PO PACK
17.0000 g | PACK | Freq: Every day | ORAL | Status: DC
  Administered 2018-10-29 – 2018-10-31 (×3): 17 g via ORAL
  Filled 2018-10-28 (×3): qty 1

## 2018-10-28 MED ORDER — ONDANSETRON HCL 4 MG/2ML IJ SOLN: 4.0000 mg | Freq: Four times a day (QID) | INTRAMUSCULAR | Status: DC | PRN

## 2018-10-28 MED ORDER — DOCUSATE SODIUM 100 MG PO CAPS
100.0000 mg | ORAL_CAPSULE | Freq: Two times a day (BID) | ORAL | Status: DC
  Administered 2018-10-28 – 2018-10-31 (×5): 100 mg via ORAL
  Filled 2018-10-28 (×6): qty 1

## 2018-10-28 MED ORDER — TRAZODONE HCL 50 MG PO TABS
25.0000 mg | ORAL_TABLET | Freq: Every evening | ORAL | Status: DC | PRN
  Administered 2018-10-30: 25 mg via ORAL
  Filled 2018-10-28: qty 1

## 2018-10-28 MED ORDER — GLIPIZIDE 10 MG PO TABS
10.0000 mg | ORAL_TABLET | Freq: Two times a day (BID) | ORAL | Status: DC
  Administered 2018-10-28 – 2018-10-29 (×2): 10 mg via ORAL
  Filled 2018-10-28 (×3): qty 1

## 2018-10-28 MED ORDER — IBUPROFEN 400 MG PO TABS
600.0000 mg | ORAL_TABLET | Freq: Three times a day (TID) | ORAL | Status: DC | PRN
  Administered 2018-10-29 – 2018-10-30 (×2): 600 mg via ORAL
  Filled 2018-10-28 (×2): qty 2

## 2018-10-28 MED ORDER — SODIUM CHLORIDE 0.9 % IV SOLN
INTRAVENOUS | Status: DC
  Administered 2018-10-28 – 2018-10-29 (×3): via INTRAVENOUS

## 2018-10-28 NOTE — ED Provider Notes (Addendum)
[facility] Emergency Department Provider Note       Time seen: ----------------------------------------- 1:06 PM on 10/28/2018 -----------------------------------------  Level V caveat: History/ROS limited by altered mental status I have reviewed the triage vital signs and the nursing notes.  HISTORY   Chief Complaint Altered Mental Status    HPI Desiree Espinoza is a 62 y.o. female with a history of choledocholithiasis, diabetes, hyperlipidemia, MS, neuromuscular disorder, thyroid disease who presents to the ED for altered mental status.  EMS brings the patient from home with altered mental status.  Unsure if anything is actually hurting the patient.  She seems to be tender in the abdomen and she was tachycardic on arrival.  No further information is available.  Past Medical History:  Diagnosis Date  . Choledocholithiasis 01/11/2018  . Diabetes mellitus without complication (HCC)   . Hyperlipemia   . MS (multiple sclerosis) (HCC)   . Neuromuscular disorder (HCC)   . Thyroid disease     Patient Active Problem List   Diagnosis Date Noted  . 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 CHOLECYSTECTOMY N/A 02/05/2018   Procedure: LAPAROSCOPIC CHOLECYSTECTOMY;  Surgeon: Henrene Dodge, MD;  Location: [facility] ORS;  Service: General;  Laterality: N/A;  . ENDOSCOPIC RETROGRADE CHOLANGIOPANCREATOGRAPHY (ERCP) WITH PROPOFOL N/A 01/12/2018   Procedure: ENDOSCOPIC RETROGRADE CHOLANGIOPANCREATOGRAPHY (ERCP) WITH PROPOFOL;  Surgeon: Midge Minium, MD;  Location: [facility] ENDOSCOPY;  Service: Endoscopy;  Laterality: N/A;    Allergies Patient has no known allergies.  Social History Social History   Tobacco Use  . Smoking status: Never Smoker  . Smokeless tobacco: Never Used  Substance Use Topics  . Alcohol use: No    Frequency: Never  . Drug use: No   Review of Systems Unknown, possible altered  mental status, abdominal pain and tachycardia  All systems negative/normal/unremarkable except as stated in the HPI  ____________________________________________   PHYSICAL EXAM:  VITAL SIGNS: ED Triage Vitals  Enc Vitals Group     BP 10/28/18 1249 (!) 123/102     Pulse Rate 10/28/18 1249 (!) 146     Resp 10/28/18 1249 20     Temp 10/28/18 1249 98.3 F (36.8 C)     Temp Source 10/28/18 1249 Oral     SpO2 10/28/18 1249 96 %     Weight --      Height --      Head Circumference --      Peak Flow --      Pain Score 10/28/18 1250 0     Pain Loc --      Pain Edu? --      Excl. in GC? --    Constitutional: Alert but disoriented.  Mild distress is noted Eyes: Conjunctivae are normal. Normal extraocular movements. ENT   Head: Normocephalic and atraumatic.   Nose: No congestion/rhinnorhea.   Mouth/Throat: Mucous membranes are dry.   Neck: No stridor. Cardiovascular: rapid rate, regular rhythm. No murmurs, rubs, or gallops. Respiratory: Normal respiratory effort without tachypnea nor retractions. Breath sounds are clear and equal bilaterally. No wheezes/rales/rhonchi. Gastrointestinal: Diffuse tenderness, normal bowel sounds. Musculoskeletal: Nontender with normal range of motion in extremities. No lower extremity tenderness nor edema. Neurologic:  Normal speech and language. No gross focal neurologic deficits are appreciated.  Patient does appear confused however Skin:  Skin is warm, dry and intact. No rash noted. Psychiatric: Mood and affect are normal. ____________________________________________  EKG: Interpreted by me.  Sinus tachycardia with  a rate of 146 bpm, normal axis, long QT, normal QRS  ____________________________________________  ED COURSE:  As part of my medical decision making, I reviewed the following data within the electronic MEDICAL RECORD NUMBER History obtained from family if available, nursing notes, old chart and ekg, as well as notes from prior  ED visits. Patient presented for altered mental status, we will assess with labs and imaging as indicated at this time.   Procedures ____________________________________________   LABS (pertinent positives/negatives)  Labs Reviewed  CBC WITH DIFFERENTIAL/PLATELET - Abnormal; Notable for the following components:      Result Value   WBC 12.3 (*)    RBC 5.29 (*)    HCT 47.3 (*)    Neutro Abs 9.1 (*)    All other components within normal limits  COMPREHENSIVE METABOLIC PANEL - Abnormal; Notable for the following components:   Glucose, Bld 258 (*)    Total Bilirubin 1.7 (*)    All other components within normal limits  T4, FREE - Abnormal; Notable for the following components:   Free T4 0.79 (*)    All other components within normal limits  URINE CULTURE  TROPONIN I  TSH  URINALYSIS, COMPLETE (UACMP) WITH MICROSCOPIC  CBG MONITORING, ED   CRITICAL CARE Performed by: Ulice DashJohnathan E Betta Balla   Total critical care time: 30 minutes  Critical care time was exclusive of separately billable procedures and treating other patients.  Critical care was necessary to treat or prevent imminent or life-threatening deterioration.  Critical care was time spent personally by me on the following activities: development of treatment plan with patient and/or surrogate as well as nursing, discussions with consultants, evaluation of patient's response to treatment, examination of patient, obtaining history from patient or surrogate, ordering and performing treatments and interventions, ordering and review of laboratory studies, ordering and review of radiographic studies, pulse oximetry and re-evaluation of patient's condition.     RADIOLOGY Images were viewed by me  CT head  IMPRESSION: Atrophy and white matter hypoattenuation likely small vessel chronic ischemic changes though could be in part due to multiple sclerosis.  No acute intracranial abnormalities. CT the abdomen pelvis with  contrastIMPRESSION: 1. Very distended bladder with abnormal bladder wall thickening, trabeculation and hyperemia. Findings are suggestive of cystitis with relative bladder outlet obstruction. Recommend decompression with Foley catheter. The mild bilateral hydronephrosis present likely relates to the bladder distention. 2. Mild biliary dilatation, likely secondary to prior cholecystectomy. No obvious evidence of choledocholithiasis or obstructing lesion by CT.  ____________________________________________  DIFFERENTIAL DIAGNOSIS   Altered mental status, dehydration, electrolyte abnormality, occult infection, sepsis, DKA, HH NK  FINAL ASSESSMENT AND PLAN  Altered mental status, urinary tract infection, acute urinary retention   Plan: The patient had presented for altered mental status. Patient's labs did indicate significant urinary tract infection, mild leukocytosis as well. Patient's imaging including CT head was unremarkable.  She had a very distended bladder on CT, we placed a Foley catheter with frank pus and almost a liter of urine output.  We have sent a urine culture and given IV Rocephin 2 g for UTI.  No other obvious abnormality is identified for her altered mental status.   Ulice DashJohnathan E Zaccai Chavarin, MD   Note: This note was generated in part or whole with voice recognition software. Voice recognition is usually quite accurate but there are transcription errors that can and very often do occur. I apologize for any typographical errors that were not detected and corrected.     Mayford KnifeWilliams,  Cecille Amsterdam, MD 10/28/18 1450    Emily Filbert, MD 10/28/18 [phone]

## 2018-10-28 NOTE — ED Notes (Signed)
ED TO INPATIENT HANDOFF REPORT  Name/Age/Gender Desiree Espinoza 62 y.o. female  Code Status    Code Status Orders  (From admission, onward)         Start     Ordered   10/28/18 1524  Full code  Continuous     10/28/18 1525        Code Status History    Date Active Date Inactive Code Status Order ID Comments User Context   01/11/2018 2155 01/13/2018 1758 Full Code [id number]  Auburn Bilberry, MD Inpatient      Home/SNF/Other   Chief Complaint weakness  Level of Care/Admitting Diagnosis ED Disposition    ED Disposition Condition Comment   Admit  Hospital Area: [facility] [[id number]]  Level of Care: Med-Surg [16]  Diagnosis: UTI (urinary tract infection) [914782]  Admitting Physician: Katha Hamming [[id number]]  Attending Physician: Katha Hamming [[id number]]  Estimated length of stay: past midnight tomorrow  Certification:: I certify this patient will need inpatient services for at least 2 midnights  PT Class (Do Not Modify): Inpatient [101]  PT Acc Code (Do Not Modify): Private [1]       Medical History Past Medical History:  Diagnosis Date  . Choledocholithiasis 01/11/2018  . Diabetes mellitus without complication (HCC)   . Hyperlipemia   . MS (multiple sclerosis) (HCC)   . Neuromuscular disorder (HCC)   . Thyroid disease     Allergies No Known Allergies  IV Location/Drains/Wounds Patient Lines/Drains/Airways Status   Active Line/Drains/Airways    Name:   Placement date:   Placement time:   Site:   Days:   Peripheral IV 10/28/18 Right Antecubital   10/28/18    1302    Antecubital   less than 1   Urethral Catheter Alivia, RN Straight-tip 16 Fr.   10/28/18    1443    Straight-tip   less than 1   External Urinary Catheter   01/12/18    1557    -   289   Incision (Closed) 02/05/18 Abdomen Other (Comment)   02/05/18    1341     265   Incision - 4 Ports Abdomen 1: Umbilicus 2: Right;Lateral 3: Right;Lateral 4: Medial   02/05/18     1339     265          Labs/Imaging Results for orders placed or performed during the hospital encounter of 10/28/18 (from the past 48 hour(s))  CBC with Differential     Status: Abnormal   Collection Time: 10/28/18 12:54 PM  Result Value Ref Range   WBC 12.3 (H) 4.0 - 10.5 K/uL   RBC 5.29 (H) 3.87 - 5.11 MIL/uL   Hemoglobin 15.0 12.0 - 15.0 g/dL   HCT 46.9 (H) 62.9 - 52.8 %   MCV 89.4 80.0 - 100.0 fL   MCH 28.4 26.0 - 34.0 pg   MCHC 31.7 30.0 - 36.0 g/dL   RDW 41.3 24.4 - 01.0 %   Platelets 361 150 - 400 K/uL   nRBC 0.0 0.0 - 0.2 %   Neutrophils Relative % 72 %   Neutro Abs 9.1 (H) 1.7 - 7.7 K/uL   Lymphocytes Relative 19 %   Lymphs Abs 2.3 0.7 - 4.0 K/uL   Monocytes Relative 7 %   Monocytes Absolute 0.8 0.1 - 1.0 K/uL   Eosinophils Relative 0 %   Eosinophils Absolute 0.0 0.0 - 0.5 K/uL   Basophils Relative 1 %   Basophils Absolute 0.1 0.0 - 0.1 K/uL  Immature Granulocytes 1 %   Abs Immature Granulocytes 0.06 0.00 - 0.07 K/uL    Comment: Performed at [facility], [address] [place], [place] [address]  Comprehensive metabolic panel     Status: Abnormal   Collection Time: 10/28/18 12:54 PM  Result Value Ref Range   Sodium 135 135 - 145 mmol/L   Potassium 4.3 3.5 - 5.1 mmol/L   Chloride 102 98 - 111 mmol/L   CO2 23 22 - 32 mmol/L   Glucose, Bld 258 (H) 70 - 99 mg/dL   BUN 16 6 - 20 mg/dL   Creatinine, Ser 3.24 0.44 - 1.00 mg/dL   Calcium 9.5 8.9 - 40.1 mg/dL   Total Protein 8.0 6.5 - 8.1 g/dL   Albumin 3.8 3.5 - 5.0 g/dL   AST 30 15 - 41 U/L   ALT 30 0 - 44 U/L   Alkaline Phosphatase 78 38 - 126 U/L   Total Bilirubin 1.7 (H) 0.3 - 1.2 mg/dL   GFR calc non Af Amer >60 >60 mL/min   GFR calc Af Amer >60 >60 mL/min   Anion gap 10 5 - 15    Comment: Performed at [facility], [address] [place], [place] [address]  Troponin I - Once     Status: None   Collection Time: 10/28/18 12:54 PM  Result Value Ref Range   Troponin I <0.03 <0.03  ng/mL    Comment: Performed at [facility], [address] [place], [place] [address]  TSH     Status: None   Collection Time: 10/28/18 12:54 PM  Result Value Ref Range   TSH 1.155 0.350 - 4.500 uIU/mL    Comment: Performed by a 3rd Generation assay with a functional sensitivity of <=0.01 uIU/mL. Performed at [facility], [address] [place], [place] [address]   T4, free     Status: Abnormal   Collection Time: 10/28/18 12:54 PM  Result Value Ref Range   Free T4 0.79 (L) 0.82 - 1.77 ng/dL    Comment: (NOTE) Biotin ingestion may interfere with free T4 tests. If the results are inconsistent with the TSH level, previous test results, or the clinical presentation, then consider biotin interference. If needed, order repeat testing after stopping biotin. Performed at [facility], [address] [place], [place] [address]   Urinalysis, Complete w Microscopic     Status: Abnormal   Collection Time: 10/28/18  2:42 PM  Result Value Ref Range   Color, Urine YELLOW (A) YELLOW   APPearance TURBID (A) CLEAR   Specific Gravity, Urine 1.024 1.005 - 1.030   pH 5.0 5.0 - 8.0   Glucose, UA >=500 (A) NEGATIVE mg/dL   Hgb urine dipstick MODERATE (A) NEGATIVE   Bilirubin Urine NEGATIVE NEGATIVE   Ketones, ur NEGATIVE NEGATIVE mg/dL   Protein, ur 387 (A) NEGATIVE mg/dL   Nitrite NEGATIVE NEGATIVE   Leukocytes, UA MODERATE (A) NEGATIVE   WBC, UA >50 (H) 0 - 5 WBC/hpf   Bacteria, UA FEW (A) NONE SEEN   Squamous Epithelial / LPF NONE SEEN 0 - 5    Comment: Performed at [facility], [address] [place], [place] [address]   Ct Head Wo Contrast  Result Date: 10/28/2018 CLINICAL DATA:  Altered mental status, headache , history multiple sclerosis, diabetes mellitus EXAM: CT HEAD WITHOUT CONTRAST TECHNIQUE: Contiguous axial images were obtained from the base of the skull through the vertex without intravenous contrast. Sagittal and coronal  MPR images  reconstructed from axial data set. COMPARISON:  None FINDINGS: Brain: Generalized atrophy. Normal ventricular morphology. No midline shift or mass effect. Deep white matter hypoattenuation, question small vessel chronic ischemic changes though could be in part related to multiple sclerosis. No intracranial hemorrhage, mass lesion, evidence of acute infarction, or extra-axial fluid collection. Vascular: No hyperdense vessels Skull: Intact Sinuses/Orbits: Clear Other: N/A IMPRESSION: Atrophy and white matter hypoattenuation likely small vessel chronic ischemic changes though could be in part due to multiple sclerosis. No acute intracranial abnormalities. Electronically Signed   By: Ulyses SouthwardMark  Boles M.D.   On: 10/28/2018 13:26   Ct Abdomen Pelvis W Contrast  Result Date: 10/28/2018 CLINICAL DATA:  Mental status change and abdominal tenderness to palpation. EXAM: CT ABDOMEN AND PELVIS WITH CONTRAST TECHNIQUE: Multidetector CT imaging of the abdomen and pelvis was performed using the standard protocol following bolus administration of intravenous contrast. CONTRAST:  100mL ISOVUE-300 IOPAMIDOL (ISOVUE-300) INJECTION 61% COMPARISON:  None. FINDINGS: Lower chest: Bibasilar atelectasis. Hepatobiliary: No focal liver abnormality is seen. Status post cholecystectomy. Mild biliary dilatation present likely related to prior cholecystectomy. No obvious choledocholithiasis. Maximum caliber extrahepatic common bile duct is approximately 9 mm. There is some mild intrahepatic biliary dilatation in the left lobe and inferior aspect of the right lobe of the liver. Pancreas: Unremarkable. No pancreatic ductal dilatation or surrounding inflammatory changes. Spleen: Normal in size without focal abnormality. Adrenals/Urinary Tract: Adrenal glands are unremarkable. There is evidence of mild bilateral hydronephrosis and hydroureter. In the pelvis, the bladder is tremendously distended and demonstrates diffuse wall thickening with  trabeculated and hyperemic bladder wall. Multiple small diverticula present. There is some surrounding edema in the perivesical fat. Findings are suggestive of probable marked cystitis and relative bladder outlet obstruction. The hydronephrosis may be secondary to urinary bladder distension and delayed imaging shows no significant delayed excretion of contrast material to suggest significant kidney dysfunction. Stomach/Bowel: Bowel shows no evidence of obstruction, ileus or inflammation. No free air. No focal lesions identified. Vascular/Lymphatic: No significant vascular findings are present. No enlarged abdominal or pelvic lymph nodes. Reproductive: Uterus and bilateral adnexa are unremarkable. Other: No abdominal wall hernia or abnormality. No focal abscess. Musculoskeletal: No acute or significant osseous findings. IMPRESSION: 1. Very distended bladder with abnormal bladder wall thickening, trabeculation and hyperemia. Findings are suggestive of cystitis with relative bladder outlet obstruction. Recommend decompression with Foley catheter. The mild bilateral hydronephrosis present likely relates to the bladder distention. 2. Mild biliary dilatation, likely secondary to prior cholecystectomy. No obvious evidence of choledocholithiasis or obstructing lesion by CT. Electronically Signed   By: Irish LackGlenn  Yamagata M.D.   On: 10/28/2018 14:20    Pending Labs Unresulted Labs (From admission, onward)    Start     Ordered   11/04/18 0500  Creatinine, serum  (enoxaparin (LOVENOX)    CrCl >/= 30 ml/min)  Weekly,   STAT    Comments:  while on enoxaparin therapy    10/28/18 1525   10/29/18 0500  Basic metabolic panel  Tomorrow morning,   STAT     10/28/18 1525   10/29/18 0500  CBC  Tomorrow morning,   STAT     10/28/18 1525   10/29/18 0500  Procalcitonin  Daily,   STAT     10/28/18 1534   10/28/18 1535  Procalcitonin - Baseline  ONCE - STAT,   STAT     10/28/18 1534   10/28/18 1523  HIV antibody (Routine  Testing)  Once,   STAT  10/28/18 1525   10/28/18 1523  CBC  (enoxaparin (LOVENOX)    CrCl >/= 30 ml/min)  Once,   STAT    Comments:  Baseline for enoxaparin therapy IF NOT ALREADY DRAWN.  Notify MD if PLT < 100 K.    10/28/18 1525   10/28/18 1523  Creatinine, serum  (enoxaparin (LOVENOX)    CrCl >/= 30 ml/min)  Once,   STAT    Comments:  Baseline for enoxaparin therapy IF NOT ALREADY DRAWN.    10/28/18 1525   10/28/18 1436  Urine Culture  Add-on,   AD     10/28/18 1435          Vitals/Pain Today's Vitals   10/28/18 1330 10/28/18 1430 10/28/18 1500 10/28/18 1530  BP: 130/73 129/83 116/83 105/66  Pulse: (!) 147 (!) 144    Resp: (!) 25 (!) 26 (!) 24 (!) 21  Temp:      TempSrc:      SpO2: 94% 96%    PainSc:        Isolation Precautions No active isolations  Medications Medications  cefTRIAXone (ROCEPHIN) 2 g in sodium chloride 0.9 % 100 mL IVPB (2 g Intravenous New Bag/Given 10/28/18 1553)  ibuprofen (ADVIL,MOTRIN) tablet 600 mg (has no administration in time range)  glipiZIDE (GLUCOTROL) tablet 10 mg (has no administration in time range)  metFORMIN (GLUCOPHAGE) tablet 1,000 mg (has no administration in time range)  Polyethylene Glycol POWD 17 g (has no administration in time range)  0.9 %  sodium chloride infusion (has no administration in time range)  acetaminophen (TYLENOL) tablet 650 mg (has no administration in time range)    Or  acetaminophen (TYLENOL) suppository 650 mg (has no administration in time range)  traZODone (DESYREL) tablet 25 mg (has no administration in time range)  docusate sodium (COLACE) capsule 100 mg (has no administration in time range)  bisacodyl (DULCOLAX) EC tablet 5 mg (has no administration in time range)  ondansetron (ZOFRAN) tablet 4 mg (has no administration in time range)    Or  ondansetron (ZOFRAN) injection 4 mg (has no administration in time range)  enoxaparin (LOVENOX) injection 40 mg (has no administration in time range)   cefTRIAXone (ROCEPHIN) 1 g in sodium chloride 0.9 % 100 mL IVPB (has no administration in time range)  0.9 %  sodium chloride infusion ( Intravenous New Bag/Given 10/28/18 1347)  0.9 %  sodium chloride infusion ( Intravenous New Bag/Given 10/28/18 1345)  iopamidol (ISOVUE-300) 61 % injection 100 mL (100 mLs Intravenous Contrast Given 10/28/18 1355)    Mobility

## 2018-10-28 NOTE — ED Triage Notes (Signed)
[facility] bringing pt for possible AMS. Interpreter at bedside. Pt is unsure of if anything is hurting. Pt seems tender in her abdomen with palpation. Pt resting comfortably at this time. ST on monitor.

## 2018-10-28 NOTE — Progress Notes (Signed)
Notified Dr. Nancy MarusMayo pt's HR is in the 140's. MD will add new orders. Will continue to monitor pt,

## 2018-10-28 NOTE — H&P (Signed)
[facility] [place] at [facility]   PATIENT NAME: Desiree Espinoza    MR#:  [id number]  DATE OF BIRTH:  20-Feb-1956  DATE OF ADMISSION:  10/28/2018  PRIMARY CARE PHYSICIAN: Patient, No Pcp Per   REQUESTING/REFERRING PHYSICIAN: Dr. Daryel November  CHIEF COMPLAINT: Altered mental status   Chief Complaint  Patient presents with  . Altered Mental Status    HISTORY OF PRESENT ILLNESS:  Desiree Espinoza  is a 62 y.o. female with a known history of MS, bedridden for the past 35 years cared by husband brought in because of altered mental status.  Patient has history of diabetes mellitus type 2, hyperlipidemia brought in from home with altered mental status by EMS.  Noted to have UTI, purulent urine obtained when inserting Foley.  Patient daughter who speaks good English told me that patient has history of not going to urine due to her MS, comes in with UTI, she is requesting Child psychotherapist for possible placement.  She is tachycardic with heart rate up to 140 bpm.  Lab work showed cytosis up to 12.3 with normal lactic acid, CT abdomen pelvis negative for any obstructive uropathy, CT head unremarkable, UA showed lots of WBC, cloudy urine.  admittimg patient for UTI, tachycardia. PAST MEDICAL HISTORY:   Past Medical History:  Diagnosis Date  . Choledocholithiasis 01/11/2018  . Diabetes mellitus without complication (HCC)   . Hyperlipemia   . MS (multiple sclerosis) (HCC)   . Neuromuscular disorder (HCC)   . Thyroid disease     PAST SURGICAL HISTOIRY:   Past Surgical History:  Procedure Laterality Date  . CHOLECYSTECTOMY N/A 02/05/2018   Procedure: LAPAROSCOPIC CHOLECYSTECTOMY;  Surgeon: Henrene Dodge, MD;  Location: [facility] ORS;  Service: General;  Laterality: N/A;  . ENDOSCOPIC RETROGRADE CHOLANGIOPANCREATOGRAPHY (ERCP) WITH PROPOFOL N/A 01/12/2018   Procedure: ENDOSCOPIC RETROGRADE CHOLANGIOPANCREATOGRAPHY (ERCP) WITH PROPOFOL;  Surgeon: Midge Minium, MD;  Location:  [facility] ENDOSCOPY;  Service: Endoscopy;  Laterality: N/A;    SOCIAL HISTORY:   Social History   Tobacco Use  . Smoking status: Never Smoker  . Smokeless tobacco: Never Used  Substance Use Topics  . Alcohol use: No    Frequency: Never    FAMILY HISTORY:   Family History  Problem Relation Age of Onset  . Diabetes Mother   . Throat cancer Father   . Breast cancer Sister   . Breast cancer Sister     DRUG ALLERGIES:  No Known Allergies  REVIEW OF SYSTEMS:  Confused.  Unable to obtain review of systems because of confusion. MEDICATIONS AT HOME:   Prior to Admission medications   Medication Sig Start Date End Date Taking? Authorizing Provider  ACCU-CHEK SMARTVIEW test strip  12/19/17   [provider]  atorvastatin (LIPITOR) 40 MG tablet Take 40 mg by mouth at bedtime.  12/19/17   [provider]  glipiZIDE (GLUCOTROL) 10 MG tablet Take 10 mg by mouth 2 (two) times daily. 12/19/17   [provider]  ibuprofen (ADVIL,MOTRIN) 600 MG tablet Take 1 tablet (600 mg total) by mouth every 8 (eight) hours as needed for fever, mild pain or moderate pain. 02/05/18   Henrene Dodge, MD  metFORMIN (GLUCOPHAGE) 1000 MG tablet Take 1,000 mg by mouth 2 (two) times daily with a meal.    [provider]  Polyethylene Glycol POWD Take 17 g by mouth daily. 02/19/18   Lattie Haw, MD      VITAL SIGNS:  Blood pressure 116/83, pulse (!) 144,  temperature 98.3 F (36.8 C), temperature source Oral, resp. rate (!) 24, SpO2 96 %.  PHYSICAL EXAMINATION:  GENERAL:  62 y.o.-year-old patient lying in the bed with no acute distress.  And is pleasantly confused. EYES: Pupils equal, round, reactive to light . No scleral icterus. Extraocular muscles intact.  HEENT: Head atraumatic, normocephalic. Oropharynx and nasopharynx clear.  NECK:  Supple, no jugular venous distention. No thyroid enlargement, no tenderness.  LUNGS: Normal breath sounds bilaterally, no wheezing,  rales,rhonchi or crepitation. No use of accessory muscles of respiration.  CARDIOVASCULAR: S1, S2 tachycardic.  No murmurs, rubs, or gallops.  ABDOMEN: Soft, nontender, nondistended. Bowel sounds present. No organomegaly or mass.  EXTREMITIES: No pedal edema, cyanosis, or clubbing.  NEUROLOGIC:  noGross neurological deficit, with able to follow commands because of underlying confusion.Marland Kitchen.  SKIN: No obvious rash, lesion, or ulcer.   LABORATORY PANEL:   CBC Recent Labs  Lab 10/28/18 1254  WBC 12.3*  HGB 15.0  HCT 47.3*  PLT 361   ------------------------------------------------------------------------------------------------------------------  Chemistries  Recent Labs  Lab 10/28/18 1254  NA 135  K 4.3  CL 102  CO2 23  GLUCOSE 258*  BUN 16  CREATININE 0.95  CALCIUM 9.5  AST 30  ALT 30  ALKPHOS 78  BILITOT 1.7*   ------------------------------------------------------------------------------------------------------------------  Cardiac Enzymes Recent Labs  Lab 10/28/18 1254  TROPONINI <0.03   ------------------------------------------------------------------------------------------------------------------  RADIOLOGY:  Ct Head Wo Contrast  Result Date: 10/28/2018 CLINICAL DATA:  Altered mental status, headache , history multiple sclerosis, diabetes mellitus EXAM: CT HEAD WITHOUT CONTRAST TECHNIQUE: Contiguous axial images were obtained from the base of the skull through the vertex without intravenous contrast. Sagittal and coronal MPR images reconstructed from axial data set. COMPARISON:  None FINDINGS: Brain: Generalized atrophy. Normal ventricular morphology. No midline shift or mass effect. Deep white matter hypoattenuation, question small vessel chronic ischemic changes though could be in part related to multiple sclerosis. No intracranial hemorrhage, mass lesion, evidence of acute infarction, or extra-axial fluid collection. Vascular: No hyperdense vessels Skull:  Intact Sinuses/Orbits: Clear Other: N/A IMPRESSION: Atrophy and white matter hypoattenuation likely small vessel chronic ischemic changes though could be in part due to multiple sclerosis. No acute intracranial abnormalities. Electronically Signed   By: Ulyses SouthwardMark  Boles M.D.   On: 10/28/2018 13:26   Ct Abdomen Pelvis W Contrast  Result Date: 10/28/2018 CLINICAL DATA:  Mental status change and abdominal tenderness to palpation. EXAM: CT ABDOMEN AND PELVIS WITH CONTRAST TECHNIQUE: Multidetector CT imaging of the abdomen and pelvis was performed using the standard protocol following bolus administration of intravenous contrast. CONTRAST:  100mL ISOVUE-300 IOPAMIDOL (ISOVUE-300) INJECTION 61% COMPARISON:  None. FINDINGS: Lower chest: Bibasilar atelectasis. Hepatobiliary: No focal liver abnormality is seen. Status post cholecystectomy. Mild biliary dilatation present likely related to prior cholecystectomy. No obvious choledocholithiasis. Maximum caliber extrahepatic common bile duct is approximately 9 mm. There is some mild intrahepatic biliary dilatation in the left lobe and inferior aspect of the right lobe of the liver. Pancreas: Unremarkable. No pancreatic ductal dilatation or surrounding inflammatory changes. Spleen: Normal in size without focal abnormality. Adrenals/Urinary Tract: Adrenal glands are unremarkable. There is evidence of mild bilateral hydronephrosis and hydroureter. In the pelvis, the bladder is tremendously distended and demonstrates diffuse wall thickening with trabeculated and hyperemic bladder wall. Multiple small diverticula present. There is some surrounding edema in the perivesical fat. Findings are suggestive of probable marked cystitis and relative bladder outlet obstruction. The hydronephrosis may be secondary to urinary bladder distension and delayed imaging shows  no significant delayed excretion of contrast material to suggest significant kidney dysfunction. Stomach/Bowel: Bowel shows no  evidence of obstruction, ileus or inflammation. No free air. No focal lesions identified. Vascular/Lymphatic: No significant vascular findings are present. No enlarged abdominal or pelvic lymph nodes. Reproductive: Uterus and bilateral adnexa are unremarkable. Other: No abdominal wall hernia or abnormality. No focal abscess. Musculoskeletal: No acute or significant osseous findings. IMPRESSION: 1. Very distended bladder with abnormal bladder wall thickening, trabeculation and hyperemia. Findings are suggestive of cystitis with relative bladder outlet obstruction. Recommend decompression with Foley catheter. The mild bilateral hydronephrosis present likely relates to the bladder distention. 2. Mild biliary dilatation, likely secondary to prior cholecystectomy. No obvious evidence of choledocholithiasis or obstructing lesion by CT. Electronically Signed   By: Irish Lack M.D.   On: 10/28/2018 14:20    EKG:   Orders placed or performed during the hospital encounter of 02/05/18  . EKG 12-Lead  . EKG 12-Lead  . EKG 12-Lead  . EKG 12-Lead  . EKG    IMPRESSION AND PLAN:   62 year old female patient came in because of altered mental status.  She has history of diabetes mellitus type 2, multiple sclerosis, bedridden for the past 35 years, husband helps her to get in the chair, according to patient daughter husband has hernia, they are looking for placement.  Patient has a history of MS, has been having issues with retaining urine, patient does not know when she has to urinate per family.  1.  Altered mental status likely secondary to metabolic encephalopathy from UTI: Patient needed to medical service, take the aggressive hydration, IV antibiotics, Possible impending sepsis check procalcitonin level, lactic acid.  2. tachycardia secondary to dehydration, UTI: Continue aggressive hydration. 3.  History of MS, bedridden: Consult Child psychotherapist for possible long-term placement as per family wishes. 4.   Diabetes mellitus type 2: Continue metformin, patient renal function is normal.  Continue sliding scale insulin with coverage as well.  All the records are reviewed and case discussed with ED provider. Management plans discussed with the patient, family and they are in agreement.  CODE STATUS: Full code  TOTAL TIME TAKING CARE OF THIS PATIENT: .    Katha Hamming M.D on 10/28/2018 at 3:25 PM  Between 7am to 6pm - Pager - [phone]  After 6pm go to [email or web address] - password [email or web address]  [facility]  Office  [phone]  CC: Primary care physician; Patient, No Pcp Per  Note: This dictation was prepared with Dragon dictation along with smaller phrase technology. Any transcriptional errors that result from this process are unintentional.

## 2018-10-28 NOTE — ED Notes (Signed)
Patient transported to CT 

## 2018-10-29 LAB — GLUCOSE, CAPILLARY
Glucose-Capillary: 81 mg/dL (ref 70–99)
Glucose-Capillary: 197 mg/dL — ABNORMAL HIGH (ref 70–99)
Glucose-Capillary: 46 mg/dL — ABNORMAL LOW (ref 70–99)
Glucose-Capillary: 114 mg/dL — ABNORMAL HIGH (ref 70–99)
Glucose-Capillary: 45 mg/dL — ABNORMAL LOW (ref 70–99)

## 2018-10-29 LAB — CBC
HCT: 34.8 % — ABNORMAL LOW (ref 36.0–46.0)
Hemoglobin: 11.1 g/dL — ABNORMAL LOW (ref 12.0–15.0)
MCH: 28.6 pg (ref 26.0–34.0)
MCHC: 31.9 g/dL (ref 30.0–36.0)
MCV: 89.7 fL (ref 80.0–100.0)
Platelets: 246 10*3/uL (ref 150–400)
RBC: 3.88 MIL/uL (ref 3.87–5.11)
RDW: 13.6 % (ref 11.5–15.5)
WBC: 10.4 10*3/uL (ref 4.0–10.5)
nRBC: 0 % (ref 0.0–0.2)

## 2018-10-29 LAB — BASIC METABOLIC PANEL
Anion gap: 6 (ref 5–15)
BUN: 8 mg/dL (ref 6–20)
CO2: 20 mmol/L — AB (ref 22–32)
Calcium: 7.5 mg/dL — ABNORMAL LOW (ref 8.9–10.3)
Chloride: 115 mmol/L — ABNORMAL HIGH (ref 98–111)
Creatinine, Ser: 0.46 mg/dL (ref 0.44–1.00)
GFR calc Af Amer: >60 mL/min (ref >60)
GFR calc non Af Amer: >60 mL/min (ref >60)
Glucose, Bld: 102 mg/dL — ABNORMAL HIGH (ref 70–99)
Potassium: 3.5 mmol/L (ref 3.5–5.1)
Sodium: 141 mmol/L (ref 135–145)

## 2018-10-29 LAB — PROCALCITONIN: Procalcitonin: 0.33 ng/mL

## 2018-10-29 MED ORDER — INSULIN ASPART 100 UNIT/ML ~~LOC~~ SOLN
0.0000 [IU] | Freq: Three times a day (TID) | SUBCUTANEOUS | Status: DC
  Administered 2018-10-29 – 2018-10-30 (×2): 2 [IU] via SUBCUTANEOUS
  Administered 2018-10-31 (×2): 1 [IU] via SUBCUTANEOUS
  Filled 2018-10-29 (×4): qty 1

## 2018-10-29 MED ORDER — INSULIN ASPART 100 UNIT/ML ~~LOC~~ SOLN: 0.0000 [IU] | Freq: Every day | SUBCUTANEOUS | Status: DC

## 2018-10-29 MED ORDER — ATORVASTATIN CALCIUM 20 MG PO TABS
40.0000 mg | ORAL_TABLET | Freq: Every day | ORAL | Status: DC
  Administered 2018-10-29 – 2018-10-30 (×2): 40 mg via ORAL
  Filled 2018-10-29 (×2): qty 2

## 2018-10-29 MED ORDER — POTASSIUM CHLORIDE CRYS ER 20 MEQ PO TBCR
40.0000 meq | EXTENDED_RELEASE_TABLET | Freq: Once | ORAL | Status: AC
  Administered 2018-10-29: 40 meq via ORAL
  Filled 2018-10-29: qty 2

## 2018-10-29 MED ORDER — DEXTROSE 50 % IV SOLN
INTRAVENOUS | Status: AC
  Administered 2018-10-29: 50 mL
  Filled 2018-10-29: qty 50

## 2018-10-29 NOTE — Progress Notes (Signed)
[facility] NAME: Desiree Espinoza    MR#:  [id number]  DATE OF BIRTH:  12-16-1955  SUBJECTIVE:  CHIEF COMPLAINT: Patient denies any nausea vomiting or abdominal pain. Chronically bedbound from multiple sclerosis and husband takes care of her at home  REVIEW OF SYSTEMS:  CONSTITUTIONAL: No fever, fatigue or weakness.  EYES: No blurred or double vision.  EARS, NOSE, AND THROAT: No tinnitus or ear pain.  RESPIRATORY: No cough, shortness of breath, wheezing or hemoptysis.  CARDIOVASCULAR: No chest pain, orthopnea, edema.  GASTROINTESTINAL: No nausea, vomiting, diarrhea or abdominal pain.  GENITOURINARY: No dysuria, hematuria.  ENDOCRINE: No polyuria, nocturia,  HEMATOLOGY: No anemia, easy bruising or bleeding SKIN: No rash or lesion. MUSCULOSKELETAL: Denies any pain in joints.  Has chronic multiple sclerosis  nEUROLOGIC: No tingling, numbness, has chronic lower extremity weakness.  PSYCHIATRY: No anxiety or depression.   DRUG ALLERGIES:  No Known Allergies  VITALS:  Blood pressure 110/77, pulse (!) 122, temperature 98 F (36.7 C), temperature source Oral, resp. rate 19, height '5\' 3"'  (1.6 m), weight 64.1 kg, SpO2 97 %.  PHYSICAL EXAMINATION:  GENERAL:  62 y.o.-year-old patient lying in the bed with no acute distress.  EYES: Pupils equal, round, reactive to light and accommodation. No scleral icterus. Extraocular muscles intact.  HEENT: Head atraumatic, normocephalic. Oropharynx and nasopharynx clear.  NECK:  Supple, no jugular venous distention. No thyroid enlargement, no tenderness.  LUNGS: Normal breath sounds bilaterally, no wheezing, rales,rhonchi or crepitation. No use of accessory muscles of respiration.  CARDIOVASCULAR: S1, S2 normal. No murmurs, rubs, or gallops.  ABDOMEN: Soft, nontender, nondistended. Bowel sounds present. No organomegaly or mass.  EXTREMITIES: No pedal edema, cyanosis, or clubbing.  NEUROLOGIC:  Chronic lower extremity weakness from multiple sclerosis and bedbound sensation intact. Gait not checked.  PSYCHIATRIC: The patient is alert and oriented x 3.  SKIN: No obvious rash, lesion, or ulcer.    LABORATORY PANEL:   CBC Recent Labs  Lab 10/29/18 0534  WBC 10.4  HGB 11.1*  HCT 34.8*  PLT 246   ------------------------------------------------------------------------------------------------------------------  Chemistries  Recent Labs  Lab 10/28/18 1254 10/29/18 0534  NA 135 141  K 4.3 3.5  CL 102 115*  CO2 23 20*  GLUCOSE 258* 102*  BUN 16 8  CREATININE 0.95 0.46  CALCIUM 9.5 7.5*  AST 30  --   ALT 30  --   ALKPHOS 78  --   BILITOT 1.7*  --    ------------------------------------------------------------------------------------------------------------------  Cardiac Enzymes Recent Labs  Lab 10/28/18 1254  TROPONINI <0.03   ------------------------------------------------------------------------------------------------------------------  RADIOLOGY:  Ct Head Wo Contrast  Result Date: 10/28/2018 CLINICAL DATA:  Altered mental status, headache , history multiple sclerosis, diabetes mellitus EXAM: CT HEAD WITHOUT CONTRAST TECHNIQUE: Contiguous axial images were obtained from the base of the skull through the vertex without intravenous contrast. Sagittal and coronal MPR images reconstructed from axial data set. COMPARISON:  None FINDINGS: Brain: Generalized atrophy. Normal ventricular morphology. No midline shift or mass effect. Deep white matter hypoattenuation, question small vessel chronic ischemic changes though could be in part related to multiple sclerosis. No intracranial hemorrhage, mass lesion, evidence of acute infarction, or extra-axial fluid collection. Vascular: No hyperdense vessels Skull: Intact Sinuses/Orbits: Clear Other: N/A IMPRESSION: Atrophy and white matter hypoattenuation likely small vessel chronic ischemic changes though could be in part due to  multiple sclerosis. No acute intracranial abnormalities. Electronically Signed   By: Crist Infante.D.  On: 10/28/2018 13:26   Ct Abdomen Pelvis W Contrast  Result Date: 10/28/2018 CLINICAL DATA:  Mental status change and abdominal tenderness to palpation. EXAM: CT ABDOMEN AND PELVIS WITH CONTRAST TECHNIQUE: Multidetector CT imaging of the abdomen and pelvis was performed using the standard protocol following bolus administration of intravenous contrast. CONTRAST:  111m ISOVUE-300 IOPAMIDOL (ISOVUE-300) INJECTION 61% COMPARISON:  None. FINDINGS: Lower chest: Bibasilar atelectasis. Hepatobiliary: No focal liver abnormality is seen. Status post cholecystectomy. Mild biliary dilatation present likely related to prior cholecystectomy. No obvious choledocholithiasis. Maximum caliber extrahepatic common bile duct is approximately 9 mm. There is some mild intrahepatic biliary dilatation in the left lobe and inferior aspect of the right lobe of the liver. Pancreas: Unremarkable. No pancreatic ductal dilatation or surrounding inflammatory changes. Spleen: Normal in size without focal abnormality. Adrenals/Urinary Tract: Adrenal glands are unremarkable. There is evidence of mild bilateral hydronephrosis and hydroureter. In the pelvis, the bladder is tremendously distended and demonstrates diffuse wall thickening with trabeculated and hyperemic bladder wall. Multiple small diverticula present. There is some surrounding edema in the perivesical fat. Findings are suggestive of probable marked cystitis and relative bladder outlet obstruction. The hydronephrosis may be secondary to urinary bladder distension and delayed imaging shows no significant delayed excretion of contrast material to suggest significant kidney dysfunction. Stomach/Bowel: Bowel shows no evidence of obstruction, ileus or inflammation. No free air. No focal lesions identified. Vascular/Lymphatic: No significant vascular findings are present. No enlarged  abdominal or pelvic lymph nodes. Reproductive: Uterus and bilateral adnexa are unremarkable. Other: No abdominal wall hernia or abnormality. No focal abscess. Musculoskeletal: No acute or significant osseous findings. IMPRESSION: 1. Very distended bladder with abnormal bladder wall thickening, trabeculation and hyperemia. Findings are suggestive of cystitis with relative bladder outlet obstruction. Recommend decompression with Foley catheter. The mild bilateral hydronephrosis present likely relates to the bladder distention. 2. Mild biliary dilatation, likely secondary to prior cholecystectomy. No obvious evidence of choledocholithiasis or obstructing lesion by CT. Electronically Signed   By: GAletta EdouardM.D.   On: 10/28/2018 14:20    EKG:   Orders placed or performed during the hospital encounter of 02/05/18  . EKG 12-Lead  . EKG 12-Lead  . EKG 12-Lead  . EKG 12-Lead  . EKG    ASSESSMENT AND PLAN:   62year old female patient came in because of altered mental status.  She has history of diabetes mellitus type 2, multiple sclerosis, bedridden for the past 370years, husband helps her to get in the chair, according to patient daughter husband has hernia, they are looking for placement.  Patient has a history of MS, has been having issues with retaining urine, patient does not know when she has to urinate per family.  1.  Altered mental status likely secondary to metabolic encephalopathy from sepsis secondary to UTI: Patient met septic criteria at the time of admission with leukocytosis and tachycardia Mentating fine today Continue IV fluids and IV Rocephin for UTI.  Urine cultures are pending Lactic acid 1.5 and procalcitonin 0.33,  Resolved leukocytosis  2. tachycardia secondary to dehydration, UTI: Continue aggressive hydration.  3.  History of MS, bedridden: Consult sEducation officer, museumfor possible long-term placement as per family wishes.  PT consult placed.  As per my discussion with the  patient she prefers going home to her husband's care.  Refusing placement at this time  4.  Diabetes mellitus type 2: Continue metformin, patient renal function is normal.  Continue sliding scale insulin with coverage as well.  All the records are reviewed and case discussed with Care Management/Social Workerr. Management plans discussed with the patient, family and they are in agreement.  CODE STATUS: fc  TOTAL TIME TAKING CARE OF THIS PATIENT: 35 minutes.   POSSIBLE D/C IN 1-2 DAYS, DEPENDING ON CLINICAL CONDITION.  Note: This dictation was prepared with Dragon dictation along with smaller phrase technology. Any transcriptional errors that result from this process are unintentional.   Nicholes Mango M.D on 10/29/2018 at 1:40 PM  Between 7am to 6pm - Pager - [phone] After 6pm go to [email or web address] - password [email or web address] [facility]  Office  [phone]  CC: Primary care physician; Patient, No Pcp Per

## 2018-10-29 NOTE — Care Management (Signed)
patient informed CM that she is able to transfer to a wheelchair at home and get to the bathroom and attend to her toilet needs by herself.  Says she does not need to go to a facility. Currently does not have any services in the home. Does not have a hospital bed. CSW contacted patient's husband to discuss and he deferred her to a daughter Desiree Espinoza.  It sound as though there are family members that may be discussing facility placement and patient is not aware.

## 2018-10-29 NOTE — Progress Notes (Signed)
Patient blood glucose 45 Dextrose  50% injection given.  Patient only tolerated 25 ml

## 2018-10-29 NOTE — [place] FL2 (Signed)
[place] MEDICAID FL2 LEVEL OF CARE SCREENING TOOL     IDENTIFICATION  Patient Name: Desiree Espinoza Birthdate: Oct 01, 1956 Sex: female Admission Date (Current Location): 10/28/2018  [place]   Facility and Address:  [facility], [address], [place], [place] [address]      Provider Number: [id number]  Attending Physician Name and Address:  Ramonita LabGouru, Aruna, MD  Relative Name and Phone Number:       Current Level of Care: Hospital Recommended Level of Care: Skilled Nursing Facility Prior Approval Number:    Date Approved/Denied:   PASRR Number:    Discharge Plan: SNF    Current Diagnoses: Patient Active Problem List   Diagnosis Date Noted  . UTI (urinary tract infection) 10/28/2018  . 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    Orientation RESPIRATION BLADDER Height & Weight     Self  Normal Incontinent Weight: 141 lb 5 oz (64.1 kg) Height:  5\' 3"  (160 cm)  BEHAVIORAL SYMPTOMS/MOOD NEUROLOGICAL BOWEL NUTRITION STATUS  (none) (no seizures; has multiple sclerosis) Incontinent Diet  AMBULATORY STATUS COMMUNICATION OF NEEDS Skin   Total Care Verbally Normal                       Personal Care Assistance Level of Assistance  Total care       Total Care Assistance: Maximum assistance   Functional Limitations Info             SPECIAL CARE FACTORS FREQUENCY                       Contractures Contractures Info: Not present    Additional Factors Info  Code Status Code Status Info: full             Current Medications (10/29/2018):  This is the current hospital active medication list Current Facility-Administered Medications  Medication Dose Route Frequency Provider Last Rate Last Dose  . acetaminophen (TYLENOL) tablet 650 mg  650 mg Oral Q6H PRN Katha HammingKonidena, Snehalatha, MD       Or  . acetaminophen (TYLENOL) suppository 650 mg  650 mg Rectal Q6H PRN Katha HammingKonidena,  Snehalatha, MD      . atorvastatin (LIPITOR) tablet 40 mg  40 mg Oral q1800 Gouru, Aruna, MD      . bisacodyl (DULCOLAX) EC tablet 5 mg  5 mg Oral Daily PRN Katha HammingKonidena, Snehalatha, MD      . cefTRIAXone (ROCEPHIN) 1 g in sodium chloride 0.9 % 100 mL IVPB  1 g Intravenous Q24H Katha HammingKonidena, Snehalatha, MD      . docusate sodium (COLACE) capsule 100 mg  100 mg Oral BID Katha HammingKonidena, Snehalatha, MD   100 mg at 10/29/18 0746  . enoxaparin (LOVENOX) injection 40 mg  40 mg Subcutaneous Q24H Katha HammingKonidena, Snehalatha, MD   40 mg at 10/28/18 2141  . ibuprofen (ADVIL,MOTRIN) tablet 600 mg  600 mg Oral Q8H PRN Katha HammingKonidena, Snehalatha, MD   600 mg at 10/29/18 0531  . insulin aspart (novoLOG) injection 0-5 Units  0-5 Units Subcutaneous QHS Gouru, Aruna, MD      . insulin aspart (novoLOG) injection 0-9 Units  0-9 Units Subcutaneous TID WC Gouru, Aruna, MD      . metFORMIN (GLUCOPHAGE) tablet 1,000 mg  1,000 mg Oral BID WC Katha HammingKonidena, Snehalatha, MD   1,000 mg at 10/29/18 0745  . ondansetron (ZOFRAN) tablet 4 mg  4 mg Oral Q6H PRN Katha HammingKonidena, Snehalatha, MD  Or  . ondansetron (ZOFRAN) injection 4 mg  4 mg Intravenous Q6H PRN Katha Hamming, MD      . polyethylene glycol (MIRALAX / GLYCOLAX) packet 17 g  17 g Oral Daily Katha Hamming, MD   17 g at 10/29/18 0746  . traZODone (DESYREL) tablet 25 mg  25 mg Oral QHS PRN Katha Hamming, MD         Discharge Medications: Please see discharge summary for a list of discharge medications.  Relevant Imaging Results:  Relevant Lab Results:   Additional Information ss: [id number]  York Spaniel, LCSW

## 2018-10-29 NOTE — Clinical Social Work Note (Signed)
Clinical Social Work Assessment  Patient Details  Name: Desiree Espinoza Alias MRN: [id number] Date of Birth: 18-Mar-1956  Date of referral:  10/29/18               Reason for consult:  Discharge Planning                Permission sought to share information with:  Facility Medical sales representative, Family Supports Permission granted to share information::  Yes, Verbal Permission Granted  Name::        Agency::     Relationship::     Contact Information:     Housing/Transportation Living arrangements for the past 2 months:  Single Family Home Source of Information:  Patient, Spouse, Adult Children Patient Interpreter Needed:  None Criminal Activity/Legal Involvement Pertinent to Current Situation/Hospitalization:  No - Comment as needed Significant Relationships:  Adult Children, Spouse Lives with:  Spouse Do you feel safe going back to the place where you live?  Yes Need for family participation in patient care:  Yes (Comment)  Care giving concerns:  Patient resides with her husband and has been total care for 35 years.   Social Worker assessment / plan:  CSW received call from patient's daughter: [name] and she stated that patient has had early signs of dementia for years and that to people that do not know patient, she appears alert and oriented X4 however she claims patient forgets. CSW informed [name] that there is nothing in patient's medical history that shows she has any early onset dementia. [name] explained that patient's husband has deferred all of the plans for his wife to his children. [name] explained that patient's husband is no longer able to care for patient and that they are wanting to look into long term care. CSW explained in detail what is involved with placing a patient under medicaid only in a nursing home. She is fully aware now that patient's disability check will have to be turned over to the facility to which she goes. [name] asked if facilities in [facility] area could  be looked at. CSW explained that she would need to pick specific nursing homes to which she would want the referral sent. CSW explained that a bed search would be conducted in [place], [place], and [place] counties and the family could work to transfer patient to a facility in [place] area later. CSW has left a list of facilities in the above mentioned counties printed from the medicare.gov website in patient's room. Will await bed offers and extend to [facility] once available.  Employment status:  Disabled (Comment on whether or not currently receiving Disability) Insurance information:    PT Recommendations:    Information / Referral to community resources:     Patient/Family's Response to care:  Patient's daughter expressed appreciation for CSW assistance.  Patient/Family's Understanding of and Emotional Response to Diagnosis, Current Treatment, and Prognosis:  Patient's daughter is hopeful her mother can be placed to alleviate the burden the care at home is putting on both her and her husband.  Emotional Assessment Appearance:  Appears stated age Attitude/Demeanor/Rapport:    Affect (typically observed):  (pleasant) Orientation:  Oriented to Self, Oriented to Place Alcohol / Substance use:  Not Applicable Psych involvement (Current and /or in the community):  No (Comment)  Discharge Needs  Concerns to be addressed:  Care Coordination Readmission within the last 30 days:  No Current discharge risk:  None Barriers to Discharge:  No Barriers Identified   York Spaniel, LCSW 10/29/2018, 4:58  PM

## 2018-10-29 NOTE — Evaluation (Signed)
Physical Therapy Evaluation Patient Details Name: Desiree Espinoza MRN: [id number] DOB: 20-Dec-1955 Today's Date: 10/29/2018   History of Present Illness  62 y.o. female with a history of choledocholithiasis, diabetes, hyperlipidemia, MS, neuromuscular disorder, thyroid disease who presents to the ED for altered mental status, work up found UTI.  Clinical Impression  Patient alert, oriented to self, situation, place (knew it was a hospital), disoriented to time. Per chart and pt, PLOF questionable. Pt adamant that prior to admission she was able to perform ADLs without assistance, and transfer from bed to [facility] independently (though without standing up per pt). Pt unable to demonstrate/explain how she perform transfers at home. Reported no falls in the last 6 months.  Pt also reported that she has not had physical therapy for a very long time, and would benefit from further PT to maximize independence, (when asked, pt reported that her husband does not like anyone touching her which is why PT stopped coming, nursing informed about comment).  Upon assessment pt able to move bilateral LE with AAROM, LLE stronger than RLE. Able to demonstrate UE ROM without physical assistance. Pt eager to mobilize and assist, but still MaxAx1 to sit EOB. Pt unable to tolerate sitting EOB without at least minAx1, crying, grimacing, moaning in pain, unable/unclear as to what was painful (with much prompting pt finally able to verbalize L thigh/leg was painful at end of session). Returned to supine. Per patient's description of PLOF pt has experienced a significant decline in functional mobility and independence and would benefit from skilled PT to address these limitations. Recommendation is STR due to current level of assistance needed for mobility.    Follow Up Recommendations SNF    Equipment Recommendations  Other (comment)(TBD at next venue of care)    Recommendations for Other Services       Precautions /  Restrictions Precautions Precautions: Fall Restrictions Weight Bearing Restrictions: No      Mobility  Bed Mobility Overal bed mobility: Needs Assistance Bed Mobility: Supine to Sit     Supine to sit: Max assist     General bed mobility comments: Pt eager to mobilize and assist, but still MaxAx1 to sit EOB. Pt unable to tolerate sitting EOB without at least minAx1, crying, grimacing, moaning in pain, unable/unclear as to what was painful (with much prompting pt finally able to verbalize L thigh/leg was painful at end of session).   Transfers                 General transfer comment: deferred due to pain  Ambulation/Gait             General Gait Details: deferred due to pain, pt also stated she has not stood in a " very long timeAdvertising copywriter [place] (Stroke Patients Only)       Balance Overall balance assessment: Needs assistance Sitting-balance support: Feet supported;Bilateral upper extremity supported Sitting balance-Leahy Scale: Poor   Postural control: Posterior lean                                   Pertinent Vitals/Pain Pain Assessment: Faces Faces Pain Scale: Hurts whole lot Pain Descriptors / Indicators: Grimacing;Guarding;Discomfort;Moaning;Crying Pain Intervention(s): Limited activity within patient's tolerance;Monitored during session;Repositioned    Home Living Family/patient expects to be discharged to:: Private residence Living Arrangements: Spouse/significant other  Available Help at Discharge: Family Type of Home: House Home Access: Level entry     Home Layout: One level Home Equipment: Shower seat - built in;Wheelchair - manual;Bedside commode Additional Comments: Pt questionable historian, chart review has conflicting information about PLOF.     Prior Function Level of Independence: Needs assistance   Gait / Transfers Assistance Needed: Pt reported up until a few  days ago, pt [facility] and bedside commode without physical assist. Does state she has not stood/walked in "a very long time". Unable to describe/demonstrate transfers, see PT clinical impression.  ADL's / Homemaking Assistance Needed: [place] she performs this activities without assistance, independent with ADLs. Husband performs IADLS.        Hand Dominance   Dominant Hand: Right    Extremity/Trunk Assessment   Upper Extremity Assessment Upper Extremity Assessment: Generalized weakness;Defer to OT evaluation    Lower Extremity Assessment Lower Extremity Assessment: Generalized weakness(AAROM for heel slides, SLR, hip abduction/adduction, RLE stronger than LLE.)       Communication   Communication: Expressive difficulties  Cognition Arousal/Alertness: Awake/alert Behavior During Therapy: WFL for tasks assessed/performed                                   General Comments: Pt oriented to self, situation, place (knew that she was in a hosptal) disoriented to time      General Comments      Exercises     Assessment/Plan    PT Assessment Patient needs continued PT services  PT Problem List Decreased strength;Pain;Decreased range of motion;Decreased activity tolerance;Decreased knowledge of use of DME;Decreased balance;Decreased mobility       PT Treatment Interventions DME instruction;Therapeutic exercise;Gait training;Balance training;Neuromuscular re-education;Functional mobility training;Therapeutic activities;Patient/family education    PT Goals (Current goals can be found in the Care Plan section)  Acute Rehab PT Goals Patient Stated Goal: to improve strength PT Goal Formulation: With patient Time For Goal Achievement: 11/12/18 Potential to Achieve Goals: Fair    Frequency Min 2X/week   Barriers to discharge        Co-evaluation               AM-PAC PT "6 Clicks" Mobility  Outcome Measure Help needed turning from your  back to your side while in a flat bed without using bedrails?: A Lot Help needed moving from lying on your back to sitting on the side of a flat bed without using bedrails?: Total Help needed moving to and from a bed to a chair (including a wheelchair)?: Total Help needed standing up from a chair using your arms (e.g., wheelchair or bedside chair)?: Total Help needed to walk in hospital room?: Total Help needed climbing 3-5 steps with a railing? : Total 6 Click Score: 7    End of Session Equipment Utilized During Treatment: Gait belt Activity Tolerance: Patient limited by pain Patient left: in bed;with call bell/phone within reach;with bed alarm set;with SCD's reapplied Nurse Communication: Mobility status;Other (comment)(pain) PT Visit Diagnosis: Other abnormalities of gait and mobility (R26.89);Difficulty in walking, not elsewhere classified (R26.2);Muscle weakness (generalized) (M62.81);Pain Pain - Right/Left: Left Pain - part of body: Leg    Time: [id number] PT Time Calculation (min) (ACUTE ONLY): 32 min   Charges:   PT Evaluation $PT Eval Moderate Complexity: 1 Mod PT Treatments $Therapeutic Activity: 8-22 mins        Olga Coasteriana Rasheema [place] PT, DPT 4:27 PM,10/29/18 [phone]

## 2018-10-29 NOTE — Progress Notes (Signed)
Inpatient Diabetes Program Recommendations  AACE/ADA: New Consensus Statement on Inpatient Glycemic Control (2015)  Target Ranges:  Prepandial:   less than 140 mg/dL      [facility] postprandial:   less than 180 mg/dL (1-2 hours)      Critically ill patients:  140 - 180 mg/dL   Results for Desiree Espinoza, Desiree Espinoza (MRN [id number]) as of 10/29/2018 09:51  Ref. Range 10/29/2018 07:36  Glucose-Capillary Latest Ref Range: 70 - 99 mg/dL 81    Admit with: AMS/ UTI  History: DM, MS (bedridden)  Home DM Meds: Metformin 1000 mg BID       Glipizide 10 mg BID  Current Orders: Metformin 1000 mg BID      Glipizide 10 mg BID      MD- Pt currently has orders for home oral DM meds.  Only checking CBGs QAM.  Please consider placing orders for Novolog Sensitive Correction Scale/ SSI (0-9 units) TID AC + HS as well     --Will follow patient during hospitalization--  Ambrose Finland RN, MSN, CDE Diabetes Coordinator Inpatient Glycemic Control Team Team Pager: [phone] (8a-5p)

## 2018-10-29 NOTE — Care Management (Addendum)
Patient brought to ED for altered mental status.  Admitted with UTI. Family provides round the clock care for patient with multiple sclerosis for the past 25 years.  It is documented that she is bed bound and family is seeking facility placement.  Have requested physical therapy consult to determine if there is any possibility patient's functional status could be improved with rehab.  Placed CSW consult.  Patient has Copy and medicaid.

## 2018-10-29 NOTE — Progress Notes (Signed)
Per MD okay for RN to DC telemetry and place order for PT consult.

## 2018-10-29 NOTE — Clinical Social Work Note (Addendum)
CSW consulted for long term placement. Nurse reports that patient's husband had stated to nursing staff that he was wanting to look into this for patient. Nursing reports that patient is able to speak fluent English. CSW spoke with patient this afternoon and she confirmed she speaks fluent [place] and did not need an interpreter. Patient stated that she did not want to look into placement in a facility and her plan is to return home at discharge with her husband. CSW contacted patient's husband via phone who requested CSW contacted his daughter [name] as she "is in charge of [place]." CSW contacted [name] and had to leave a message. York Spaniel MSW,LCSW [phone]

## 2018-10-30 LAB — GLUCOSE, CAPILLARY
Glucose-Capillary: 106 mg/dL — ABNORMAL HIGH (ref 70–99)
Glucose-Capillary: 173 mg/dL — ABNORMAL HIGH (ref 70–99)
Glucose-Capillary: 69 mg/dL — ABNORMAL LOW (ref 70–99)
Glucose-Capillary: 146 mg/dL — ABNORMAL HIGH (ref 70–99)

## 2018-10-30 LAB — POTASSIUM: Potassium: 3.3 mmol/L — ABNORMAL LOW (ref 3.5–5.1)

## 2018-10-30 LAB — HIV ANTIBODY (ROUTINE TESTING W REFLEX): HIV Screen 4th Generation wRfx: NONREACTIVE

## 2018-10-30 LAB — PROCALCITONIN: Procalcitonin: 0.24 ng/mL

## 2018-10-30 MED ORDER — CEFDINIR 300 MG PO CAPS
300.0000 mg | ORAL_CAPSULE | Freq: Two times a day (BID) | ORAL | Status: DC
  Administered 2018-10-30 – 2018-10-31 (×3): 300 mg via ORAL
  Filled 2018-10-30 (×4): qty 1

## 2018-10-30 MED ORDER — ACETAMINOPHEN 325 MG PO TABS: 650.0000 mg | ORAL_TABLET | Freq: Four times a day (QID) | ORAL | Status: DC | PRN

## 2018-10-30 MED ORDER — POTASSIUM CHLORIDE CRYS ER 20 MEQ PO TBCR
40.0000 meq | EXTENDED_RELEASE_TABLET | Freq: Once | ORAL | Status: AC
  Administered 2018-10-30: 40 meq via ORAL
  Filled 2018-10-30: qty 2

## 2018-10-30 MED ORDER — ONDANSETRON HCL 4 MG PO TABS: 4.0000 mg | ORAL_TABLET | Freq: Four times a day (QID) | ORAL | 0 refills | Status: DC | PRN

## 2018-10-30 MED ORDER — CEFDINIR 300 MG PO CAPS: 300.0000 mg | ORAL_CAPSULE | Freq: Two times a day (BID) | ORAL | 0 refills | Status: DC

## 2018-10-30 NOTE — Progress Notes (Signed)
10/30/2018 1600  Called and gave report to receiving nurse at [facility].  Answered all questions and left phone number for any follow up info.  Bradly Chris, RN

## 2018-10-30 NOTE — Clinical Social Work Note (Signed)
Patient is medically ready for discharge today. CSW met with patient, husband and daughter Raquel at bedside. CSW explained bed offers and that Medicaid would be the primary payor since therapy is not appropriate at this time. Family in agreement. CSW explained that patient will need to sign over SSI check for 30 days. Family also in agreement. Family chose [facility]. Patient will discharge today and be transported by EMS. RN to call report and call for transport.   [place], [place]

## 2018-10-30 NOTE — Discharge Summary (Addendum)
[facility] NAME: Desiree Espinoza    MR#:  [id number]  DATE OF BIRTH:  26-Feb-1956  DATE OF ADMISSION:  10/28/2018 ADMITTING PHYSICIAN: Epifanio Lesches, MD  DATE OF DISCHARGE: 10/31/2018 PRIMARY CARE PHYSICIAN: Patient, No Pcp Per    ADMISSION DIAGNOSIS:  Acute cystitis with hematuria [N30.01] Acute urinary retention [R33.8] Altered mental status, unspecified altered mental status type [R41.82]  DISCHARGE DIAGNOSIS:  Active Problems:   UTI (urinary tract infection) Bedbound from chronic multiple sclerosis  SECONDARY DIAGNOSIS:   Past Medical History:  Diagnosis Date  . Choledocholithiasis 01/11/2018  . Diabetes mellitus without complication ([place])   . Hyperlipemia   . MS (multiple sclerosis) ([place])   . Neuromuscular disorder ([employer])   . Thyroid disease     HOSPITAL COURSE:   hpi  Desiree Espinoza  is a 62 y.o. female with a known history of MS, bedridden for the past 35 years cared by husband brought in because of altered mental status.  Patient has history of diabetes mellitus type 2, hyperlipidemia brought in from home with altered mental status by EMS.  Noted to have UTI, purulent urine obtained when inserting Foley.  Patient daughter who speaks good English told me that patient has history of not going to urine due to her MS, comes in with UTI, she is requesting [facility] for possible placement.  She is tachycardic with heart rate up to 140 bpm.  Lab work showed cytosis up to 12.3 with normal lactic acid, CT abdomen pelvis negative for any obstructive uropathy, CT head unremarkable, UA showed lots of WBC, cloudy urine.  admittimg patient for UTI, tachycardia.  Hospital course  .Acute metabolic encephalopathy from sepsis and  UTI: Patient met septic criteria at the time of admission with leukocytosis and tachycardia Mental status much improved Clinically improved with IV fluids and IV Rocephin.  Leukocytosis  resolved.  Urine culture with Klebsiella .  Will discharge patient with p.o. Omnicef  lactic acid 1.5 and procalcitonin 0.33,  Resolved leukocytosis  2.Tachycardia secondary to dehydration, UTI: Improved with IV fluids  3. History of MS, bedridden: Consult [facility] for possible long-term placement as per family wishes.  PT consult placed-recommending skilled nursing facility Discharge to [facility] today  4. Diabetes mellitus type 2: Continue metformin, patient renal function is normal.  Sliding scale provided during the hospital course  Discharge patient to SNF DISCHARGE CONDITIONS:   FAIR  CONSULTS OBTAINED:     PROCEDURES NONE  DRUG ALLERGIES:  No Known Allergies  DISCHARGE MEDICATIONS:   Allergies as of 10/30/2018   No Known Allergies     Medication List    STOP taking these medications   ibuprofen 600 MG tablet Commonly known as:  ADVIL,MOTRIN     TAKE these medications   acetaminophen 325 MG tablet Commonly known as:  TYLENOL Take 2 tablets (650 mg total) by mouth every 6 (six) hours as needed for mild pain (or Fever >/= 101).   atorvastatin 40 MG tablet Commonly known as:  LIPITOR Take 40 mg by mouth at bedtime.   cefdinir 300 MG capsule Commonly known as:  OMNICEF Take 1 capsule (300 mg total) by mouth every 12 (twelve) hours.   glipiZIDE 10 MG tablet Commonly known as:  GLUCOTROL Take 10 mg by mouth 2 (two) times daily.   metFORMIN 1000 MG tablet Commonly known as:  GLUCOPHAGE Take 1,000 mg by mouth 2 (two) times daily with a meal.   ondansetron  4 MG tablet Commonly known as:  ZOFRAN Take 1 tablet (4 mg total) by mouth every 6 (six) hours as needed for nausea.   Polyethylene Glycol Powd Take 17 g by mouth daily.        DISCHARGE INSTRUCTIONS:  Follow-up with primary care physician in 3 to 4 days.  PCP to follow-up on the urine culture and test results which are pending   DIET:  Diabetic diet  DISCHARGE CONDITION:   Fair  ACTIVITY:  Activity as tolerated  OXYGEN:  Home Oxygen: No.   Oxygen Delivery: room air  DISCHARGE LOCATION:  home   If you experience worsening of your admission symptoms, develop shortness of breath, life threatening emergency, suicidal or homicidal thoughts you must seek medical attention immediately by calling 911 or calling your MD immediately  if symptoms less severe.  You Must read complete instructions/literature along with all the possible adverse reactions/side effects for all the Medicines you take and that have been prescribed to you. Take any new Medicines after you have completely understood and accpet all the possible adverse reactions/side effects.   Please note  You were cared for by a hospitalist during your hospital stay. If you have any questions about your discharge medications or the care you received while you were in the hospital after you are discharged, you can call the unit and asked to speak with the hospitalist on call if the hospitalist that took care of you is not available. Once you are discharged, your primary care physician will handle any further medical issues. Please note that NO REFILLS for any discharge medications will be authorized once you are discharged, as it is imperative that you return to your primary care physician (or establish a relationship with a primary care physician if you do not have one) for your aftercare needs so that they can reassess your need for medications and monitor your lab values.     Today  Chief Complaint  Patient presents with  . Altered Mental Status   Patient is feeling much better.  Denies any nausea vomiting abdominal pain or flank pain and wants to go home.  ROS:  CONSTITUTIONAL: Denies fevers, chills. Denies any fatigue, weakness.  EYES: Denies blurry vision, double vision, eye pain. EARS, NOSE, THROAT: Denies tinnitus, ear pain, hearing loss. RESPIRATORY: Denies cough, wheeze, shortness of breath.   CARDIOVASCULAR: Denies chest pain, palpitations, edema.  GASTROINTESTINAL: Denies nausea, vomiting, diarrhea, abdominal pain. Denies bright red blood per rectum. GENITOURINARY: Denies dysuria, hematuria. ENDOCRINE: Denies nocturia or thyroid problems. HEMATOLOGIC AND LYMPHATIC: Denies easy bruising or bleeding. SKIN: Denies rash or lesion. MUSCULOSKELETAL: Denies pain in neck, back, shoulder, knees, hips or arthritic symptoms.  NEUROLOGIC: Denies paralysis, paresthesias.  PSYCHIATRIC: Denies anxiety or depressive symptoms.   VITAL SIGNS:  Blood pressure 122/81, pulse 96, temperature 97.6 F (36.4 C), temperature source Oral, resp. rate 17, height _0  (1.6 m), weight 64.1 kg, SpO2 100 %.  I/O:    Intake/Output Summary (Last 24 hours) at 10/30/2018 1331 Last data filed at 10/30/2018 0948 Gross per 24 hour  Intake 2139.62 ml  Output 1150 ml  Net 989.62 ml    PHYSICAL EXAMINATION:  GENERAL:  62 y.o.-year-old patient lying in the bed with no acute distress.  EYES: Pupils equal, round, reactive to light and accommodation. No scleral icterus. Extraocular muscles intact.  HEENT: Head atraumatic, normocephalic. Oropharynx and nasopharynx clear.  NECK:  Supple, no jugular venous distention. No thyroid enlargement, no tenderness.  LUNGS: Normal breath  sounds bilaterally, no wheezing, rales,rhonchi or crepitation. No use of accessory muscles of respiration.  CARDIOVASCULAR: S1, S2 normal. No murmurs, rubs, or gallops.  ABDOMEN: Soft, non-tender, non-distended. Bowel sounds present.  EXTREMITIES: No pedal edema, cyanosis, or clubbing.  NEUROLOGIC: Awake, alert and oriented x3 sensation intact. Gait not checked.  Patient is chronically bedbound  pSYCHIATRIC: The patient is alert and oriented x 3.  SKIN: No obvious rash, lesion, or ulcer.   DATA REVIEW:   CBC Recent Labs  Lab 10/29/18 0534  WBC 10.4  HGB 11.1*  HCT 34.8*  PLT 246    Chemistries  Recent Labs  Lab  10/28/18 1254 10/29/18 0534 10/30/18 0454  NA 135 141  --   K 4.3 3.5 3.3*  CL 102 115*  --   CO2 23 20*  --   GLUCOSE 258* 102*  --   BUN 16 8  --   CREATININE 0.95 0.46  --   CALCIUM 9.5 7.5*  --   AST 30  --   --   ALT 30  --   --   ALKPHOS 78  --   --   BILITOT 1.7*  --   --     Cardiac Enzymes Recent Labs  Lab 10/28/18 1254  TROPONINI <0.03    Microbiology Results  Results for orders placed or performed during the hospital encounter of 10/28/18  Urine Culture     Status: Abnormal (Preliminary result)   Collection Time: 10/28/18  2:42 PM  Result Value Ref Range Status   Specimen Description   Final    URINE, RANDOM Performed at [facility], [address] [place], [place] [address]    Special Requests   Final    NONE Performed at [facility], [address], [place], [place] [address]    Culture (A)  Final    >=100,000 COLONIES/mL KLEBSIELLA PNEUMONIAE SUSCEPTIBILITIES TO FOLLOW Performed at [facility], [place] [address] [place],  [address]    Report Status PENDING  Incomplete    RADIOLOGY:  Ct Head Wo Contrast  Result Date: 10/28/2018 CLINICAL DATA:  Altered mental status, headache , history multiple sclerosis, diabetes mellitus EXAM: CT HEAD WITHOUT CONTRAST TECHNIQUE: Contiguous axial images were obtained from the base of the skull through the vertex without intravenous contrast. Sagittal and coronal MPR images reconstructed from axial data set. COMPARISON:  None FINDINGS: Brain: Generalized atrophy. Normal ventricular morphology. No midline shift or mass effect. Deep white matter hypoattenuation, question small vessel chronic ischemic changes though could be in part related to multiple sclerosis. No intracranial hemorrhage, mass lesion, evidence of acute infarction, or extra-axial fluid collection. Vascular: No hyperdense vessels Skull: Intact Sinuses/Orbits: Clear Other: N/A IMPRESSION: Atrophy and white matter  hypoattenuation likely small vessel chronic ischemic changes though could be in part due to multiple sclerosis. No acute intracranial abnormalities. Electronically Signed   By: Lavonia Dana M.D.   On: 10/28/2018 13:26   Ct Abdomen Pelvis W Contrast  Result Date: 10/28/2018 CLINICAL DATA:  Mental status change and abdominal tenderness to palpation. EXAM: CT ABDOMEN AND PELVIS WITH CONTRAST TECHNIQUE: Multidetector CT imaging of the abdomen and pelvis was performed using the standard protocol following bolus administration of intravenous contrast. CONTRAST:  134m ISOVUE-300 IOPAMIDOL (ISOVUE-300) INJECTION 61% COMPARISON:  None. FINDINGS: Lower chest: Bibasilar atelectasis. Hepatobiliary: No focal liver abnormality is seen. Status post cholecystectomy. Mild biliary dilatation present likely related to prior cholecystectomy. No obvious choledocholithiasis. Maximum caliber extrahepatic common bile duct  is approximately 9 mm. There is some mild intrahepatic biliary dilatation in the left lobe and inferior aspect of the right lobe of the liver. Pancreas: Unremarkable. No pancreatic ductal dilatation or surrounding inflammatory changes. Spleen: Normal in size without focal abnormality. Adrenals/Urinary Tract: Adrenal glands are unremarkable. There is evidence of mild bilateral hydronephrosis and hydroureter. In the pelvis, the bladder is tremendously distended and demonstrates diffuse wall thickening with trabeculated and hyperemic bladder wall. Multiple small diverticula present. There is some surrounding edema in the perivesical fat. Findings are suggestive of probable marked cystitis and relative bladder outlet obstruction. The hydronephrosis may be secondary to urinary bladder distension and delayed imaging shows no significant delayed excretion of contrast material to suggest significant kidney dysfunction. Stomach/Bowel: Bowel shows no evidence of obstruction, ileus or inflammation. No free air. No focal lesions  identified. Vascular/Lymphatic: No significant vascular findings are present. No enlarged abdominal or pelvic lymph nodes. Reproductive: Uterus and bilateral adnexa are unremarkable. Other: No abdominal wall hernia or abnormality. No focal abscess. Musculoskeletal: No acute or significant osseous findings. IMPRESSION: 1. Very distended bladder with abnormal bladder wall thickening, trabeculation and hyperemia. Findings are suggestive of cystitis with relative bladder outlet obstruction. Recommend decompression with Foley catheter. The mild bilateral hydronephrosis present likely relates to the bladder distention. 2. Mild biliary dilatation, likely secondary to prior cholecystectomy. No obvious evidence of choledocholithiasis or obstructing lesion by CT. Electronically Signed   By: Aletta Edouard M.D.   On: 10/28/2018 14:20    EKG:   Orders placed or performed during the hospital encounter of 02/05/18  . EKG 12-Lead  . EKG 12-Lead  . EKG 12-Lead  . EKG 12-Lead  . EKG      Management plans discussed with the patient, family and they are in agreement.  CODE STATUS:     Code Status Orders  (From admission, onward)         Start     Ordered   10/28/18 1524  Full code  Continuous     10/28/18 1525        Code Status History    Date Active Date Inactive Code Status Order ID Comments User Context   01/11/2018 2155 01/13/2018 1758 Full Code [id number]  Dustin Flock, MD Inpatient      TOTAL TIME TAKING CARE OF THIS PATIENT: 40 minutes.   Note: This dictation was prepared with Dragon dictation along with smaller phrase technology. Any transcriptional errors that result from this process are unintentional.   _0 @  on 10/31/2018 at 12:19 PM  Between 7am to 6pm - Pager - [phone]  After 6pm go to [email or web address] - password [email or web address]  Office  [phone]  CC: Primary care physician; Patient, No Pcp Per

## 2018-10-30 NOTE — Discharge Instructions (Signed)
Follow-up with primary care physician in 3 to 4 days.  PCP to follow-up on the urine culture and test results which are pending

## 2018-10-30 NOTE — Progress Notes (Signed)
10/30/2018 1730  Received call from patient's daughter saying that family though patient was being discharged to [facility] instead of [facility].  Upon arriving at [facility], they did not want the patient to go to this facility.  I explained that arrangements were made for patient to go to [facility], and as she was medically stable for discharge, she would need to go there.  Reviewed CSW note that states family agreed on [facility] and not [facility].  Patient's daughter states that no matter what she would not want patient there and that she would rather take the patient home herself.  No CSW available at the time.  Spoke with Grayce SessionsAC Jackie who noticed patient's discharge order was to go "home" and not to SNF or rehab.  Attending physician Gouru was already gone for the day.  Called doctor on call Luberta MutterKonidena, who said to cancel discharge until tomorrow.  Bradly Chrisougherty, Burnie Therien E, RN

## 2018-10-31 LAB — URINE CULTURE

## 2018-10-31 LAB — GLUCOSE, CAPILLARY
Glucose-Capillary: 123 mg/dL — ABNORMAL HIGH (ref 70–99)
Glucose-Capillary: 128 mg/dL — ABNORMAL HIGH (ref 70–99)

## 2018-10-31 NOTE — Progress Notes (Signed)
RN called report to Dewayne Hatch at [facility] and EMS called for transport.

## 2018-10-31 NOTE — Clinical Social Work Note (Signed)
The patient will discharge today to [facility] Room 302A via non-emergent EMS. The CSW has left a HIPPA compliant voicemail for the patient's daughter, Rutherford Nailsabel, notifying her of imminent discharge and the room number. The CSW has delivered the discharge packet and has sent all updated discharge paperwork to the facility. The CSW is signing off. Please consult should needs arise.  Argentina PonderKaren Martha Roselia Snipe, MSW, Theresia MajorsLCSWA [phone]

## 2018-10-31 NOTE — Progress Notes (Signed)
Pt left with EMS 

## 2018-10-31 NOTE — Clinical Social Work Note (Signed)
The CSW has discussed this patient with Inetta Fermo, admissions coordinator for [facility]. The patient can discharge to [facility], Room 302A, today. The CSW has updated the attending MD and will process the discharge paperwork and discuss with the family as soon as possible.  Argentina Ponder, MSW, Theresia Majors [phone]

## 2018-12-08 ENCOUNTER — Other Ambulatory Visit: Payer: Self-pay | Admitting: Acute Care

## 2018-12-08 DIAGNOSIS — G35 Multiple sclerosis: Secondary | ICD-10-CM

## 2018-12-17 ENCOUNTER — Other Ambulatory Visit: Payer: Self-pay

## 2018-12-17 ENCOUNTER — Inpatient Hospital Stay
Admission: EM | Admit: 2018-12-17 | Discharge: 2018-12-22 | DRG: 871 | Disposition: A | Discharge status: Skilled Nursing Facility | Payer: Medicare HMO | Attending: Internal Medicine | Admitting: Internal Medicine

## 2018-12-17 ENCOUNTER — Emergency Department: Payer: Medicare HMO

## 2018-12-17 ENCOUNTER — Encounter: Payer: Self-pay | Admitting: Emergency Medicine

## 2018-12-17 DIAGNOSIS — N390 Urinary tract infection, site not specified: Secondary | ICD-10-CM | POA: Diagnosis present

## 2018-12-17 DIAGNOSIS — Z833 Family history of diabetes mellitus: Secondary | ICD-10-CM | POA: Diagnosis not present

## 2018-12-17 DIAGNOSIS — G9341 Metabolic encephalopathy: Secondary | ICD-10-CM | POA: Diagnosis present

## 2018-12-17 DIAGNOSIS — Z79899 Other long term (current) drug therapy: Secondary | ICD-10-CM

## 2018-12-17 DIAGNOSIS — F0390 Unspecified dementia without behavioral disturbance: Secondary | ICD-10-CM | POA: Diagnosis present

## 2018-12-17 DIAGNOSIS — J40 Bronchitis, not specified as acute or chronic: Secondary | ICD-10-CM | POA: Diagnosis present

## 2018-12-17 DIAGNOSIS — J111 Influenza due to unidentified influenza virus with other respiratory manifestations: Secondary | ICD-10-CM | POA: Diagnosis present

## 2018-12-17 DIAGNOSIS — E11649 Type 2 diabetes mellitus with hypoglycemia without coma: Secondary | ICD-10-CM | POA: Diagnosis not present

## 2018-12-17 DIAGNOSIS — E876 Hypokalemia: Secondary | ICD-10-CM | POA: Diagnosis present

## 2018-12-17 DIAGNOSIS — Z7984 Long term (current) use of oral hypoglycemic drugs: Secondary | ICD-10-CM | POA: Diagnosis not present

## 2018-12-17 DIAGNOSIS — B961 Klebsiella pneumoniae [K. pneumoniae] as the cause of diseases classified elsewhere: Secondary | ICD-10-CM | POA: Diagnosis present

## 2018-12-17 DIAGNOSIS — G35 Multiple sclerosis: Secondary | ICD-10-CM | POA: Diagnosis present

## 2018-12-17 DIAGNOSIS — Z9049 Acquired absence of other specified parts of digestive tract: Secondary | ICD-10-CM | POA: Diagnosis not present

## 2018-12-17 DIAGNOSIS — J101 Influenza due to other identified influenza virus with other respiratory manifestations: Secondary | ICD-10-CM | POA: Diagnosis present

## 2018-12-17 DIAGNOSIS — A419 Sepsis, unspecified organism: Principal | ICD-10-CM | POA: Diagnosis present

## 2018-12-17 DIAGNOSIS — Z7401 Bed confinement status: Secondary | ICD-10-CM | POA: Diagnosis not present

## 2018-12-17 DIAGNOSIS — E785 Hyperlipidemia, unspecified: Secondary | ICD-10-CM | POA: Diagnosis present

## 2018-12-17 LAB — CBC WITH DIFFERENTIAL/PLATELET
Abs Immature Granulocytes: 0.01 10*3/uL (ref 0.00–0.07)
BASOS ABS: 0 10*3/uL (ref 0.0–0.1)
Basophils Relative: 0 %
EOS PCT: 0 %
Eosinophils Absolute: 0 10*3/uL (ref 0.0–0.5)
HCT: 43.1 % (ref 36.0–46.0)
Hemoglobin: 13.9 g/dL (ref 12.0–15.0)
Immature Granulocytes: 0 %
Lymphocytes Relative: 18 %
Lymphs Abs: 1.1 10*3/uL (ref 0.7–4.0)
MCH: 27.4 pg (ref 26.0–34.0)
MCHC: 32.3 g/dL (ref 30.0–36.0)
MCV: 85 fL (ref 80.0–100.0)
Monocytes Absolute: 0.7 10*3/uL (ref 0.1–1.0)
Monocytes Relative: 12 %
Neutro Abs: 4.1 10*3/uL (ref 1.7–7.7)
Neutrophils Relative %: 70 %
Platelets: 304 10*3/uL (ref 150–400)
RBC: 5.07 MIL/uL (ref 3.87–5.11)
RDW: 13.7 % (ref 11.5–15.5)
WBC: 5.9 10*3/uL (ref 4.0–10.5)
nRBC: 0 % (ref 0.0–0.2)

## 2018-12-17 LAB — URINALYSIS, COMPLETE (UACMP) WITH MICROSCOPIC
Bilirubin Urine: NEGATIVE
Glucose, UA: NEGATIVE mg/dL
Ketones, ur: 5 mg/dL — AB
Nitrite: NEGATIVE
PROTEIN: 30 mg/dL — AB
SQUAMOUS EPITHELIAL / LPF: NONE SEEN (ref 0–5)
Specific Gravity, Urine: 1.017 (ref 1.005–1.030)
WBC, UA: >50 WBC/hpf — ABNORMAL HIGH (ref 0–5)
pH: 6 (ref 5.0–8.0)

## 2018-12-17 LAB — COMPREHENSIVE METABOLIC PANEL
ALT: 20 U/L (ref 0–44)
AST: 29 U/L (ref 15–41)
Albumin: 4 g/dL (ref 3.5–5.0)
Alkaline Phosphatase: 73 U/L (ref 38–126)
Anion gap: 10 (ref 5–15)
BUN: 8 mg/dL (ref 6–20)
CO2: 22 mmol/L (ref 22–32)
CREATININE: 0.41 mg/dL — AB (ref 0.44–1.00)
Calcium: 9.1 mg/dL (ref 8.9–10.3)
Chloride: 103 mmol/L (ref 98–111)
GFR calc Af Amer: >60 mL/min (ref >60)
Glucose, Bld: 138 mg/dL — ABNORMAL HIGH (ref 70–99)
Potassium: 4 mmol/L (ref 3.5–5.1)
Sodium: 135 mmol/L (ref 135–145)
Total Bilirubin: 0.6 mg/dL (ref 0.3–1.2)
Total Protein: 7.6 g/dL (ref 6.5–8.1)

## 2018-12-17 LAB — GLUCOSE, CAPILLARY
Glucose-Capillary: 53 mg/dL — ABNORMAL LOW (ref 70–99)
Glucose-Capillary: 81 mg/dL (ref 70–99)
Glucose-Capillary: 171 mg/dL — ABNORMAL HIGH (ref 70–99)

## 2018-12-17 LAB — INFLUENZA PANEL BY PCR (TYPE A & B)
INFLAPCR: POSITIVE — AB
Influenza B By PCR: NEGATIVE

## 2018-12-17 LAB — LACTIC ACID, PLASMA
LACTIC ACID, VENOUS: 1.1 mmol/L (ref 0.5–1.9)
Lactic Acid, Venous: 2.5 mmol/L (ref 0.5–1.9)
Lactic Acid, Venous: 2.1 mmol/L (ref 0.5–1.9)

## 2018-12-17 LAB — MRSA PCR SCREENING: MRSA by PCR: NEGATIVE

## 2018-12-17 MED ORDER — ONDANSETRON HCL 4 MG/2ML IJ SOLN: 4.0000 mg | Freq: Four times a day (QID) | INTRAMUSCULAR | Status: DC | PRN

## 2018-12-17 MED ORDER — SODIUM CHLORIDE 0.9 % IV SOLN
2.0000 g | Freq: Once | INTRAVENOUS | Status: AC
  Administered 2018-12-17: 2 g via INTRAVENOUS
  Filled 2018-12-17: qty 2

## 2018-12-17 MED ORDER — KETOROLAC TROMETHAMINE 30 MG/ML IJ SOLN
15.0000 mg | Freq: Once | INTRAMUSCULAR | Status: AC
  Administered 2018-12-17: 15 mg via INTRAVENOUS
  Filled 2018-12-17: qty 1

## 2018-12-17 MED ORDER — POLYETHYLENE GLYCOL 3350 17 G PO PACK
17.0000 g | PACK | Freq: Every day | ORAL | Status: DC
  Administered 2018-12-18 – 2018-12-22 (×5): 17 g via ORAL
  Filled 2018-12-17 (×6): qty 1

## 2018-12-17 MED ORDER — ALBUTEROL SULFATE (2.5 MG/3ML) 0.083% IN NEBU: 2.5000 mg | INHALATION_SOLUTION | RESPIRATORY_TRACT | Status: DC | PRN

## 2018-12-17 MED ORDER — ATORVASTATIN CALCIUM 20 MG PO TABS
40.0000 mg | ORAL_TABLET | Freq: Every day | ORAL | Status: DC
  Administered 2018-12-17 – 2018-12-21 (×5): 40 mg via ORAL
  Filled 2018-12-17 (×5): qty 2

## 2018-12-17 MED ORDER — ACETAMINOPHEN 650 MG RE SUPP: 650.0000 mg | Freq: Four times a day (QID) | RECTAL | Status: DC | PRN

## 2018-12-17 MED ORDER — VANCOMYCIN HCL 10 G IV SOLR
1500.0000 mg | Freq: Once | INTRAVENOUS | Status: AC
  Administered 2018-12-17: 1500 mg via INTRAVENOUS
  Filled 2018-12-17: qty 1500

## 2018-12-17 MED ORDER — SODIUM CHLORIDE 0.9 % IV SOLN: 2.0000 g | Freq: Three times a day (TID) | INTRAVENOUS | Status: DC

## 2018-12-17 MED ORDER — ACETAMINOPHEN 325 MG PO TABS
650.0000 mg | ORAL_TABLET | Freq: Four times a day (QID) | ORAL | Status: DC | PRN
  Administered 2018-12-18: 650 mg via ORAL
  Filled 2018-12-17: qty 2

## 2018-12-17 MED ORDER — ONDANSETRON HCL 4 MG PO TABS: 4.0000 mg | ORAL_TABLET | Freq: Four times a day (QID) | ORAL | Status: DC | PRN

## 2018-12-17 MED ORDER — OSELTAMIVIR PHOSPHATE 75 MG PO CAPS
75.0000 mg | ORAL_CAPSULE | Freq: Two times a day (BID) | ORAL | Status: AC
  Administered 2018-12-17 – 2018-12-21 (×10): 75 mg via ORAL
  Filled 2018-12-17 (×10): qty 1

## 2018-12-17 MED ORDER — METRONIDAZOLE IN NACL 5-0.79 MG/ML-% IV SOLN
500.0000 mg | Freq: Three times a day (TID) | INTRAVENOUS | Status: DC
  Administered 2018-12-17 – 2018-12-18 (×3): 500 mg via INTRAVENOUS
  Filled 2018-12-17 (×5): qty 100

## 2018-12-17 MED ORDER — IPRATROPIUM-ALBUTEROL 0.5-2.5 (3) MG/3ML IN SOLN
3.0000 mL | Freq: Four times a day (QID) | RESPIRATORY_TRACT | Status: DC
  Administered 2018-12-17: 3 mL via RESPIRATORY_TRACT
  Filled 2018-12-17: qty 3

## 2018-12-17 MED ORDER — POLYETHYLENE GLYCOL 3350 17 G PO PACK: 17.0000 g | PACK | Freq: Every day | ORAL | Status: DC | PRN

## 2018-12-17 MED ORDER — VANCOMYCIN HCL IN DEXTROSE 1-5 GM/200ML-% IV SOLN: 1000.0000 mg | Freq: Once | INTRAVENOUS | Status: DC

## 2018-12-17 MED ORDER — SODIUM CHLORIDE 0.9 % IV BOLUS
1000.0000 mL | Freq: Once | INTRAVENOUS | Status: AC
  Administered 2018-12-17: 1000 mL via INTRAVENOUS

## 2018-12-17 MED ORDER — GLIPIZIDE ER 2.5 MG PO TB24
2.5000 mg | ORAL_TABLET | Freq: Every day | ORAL | Status: DC
  Administered 2018-12-18: 2.5 mg via ORAL
  Filled 2018-12-17 (×2): qty 1

## 2018-12-17 MED ORDER — SODIUM CHLORIDE 0.9% FLUSH
3.0000 mL | Freq: Two times a day (BID) | INTRAVENOUS | Status: DC
  Administered 2018-12-17 – 2018-12-21 (×7): 3 mL via INTRAVENOUS

## 2018-12-17 MED ORDER — ENOXAPARIN SODIUM 40 MG/0.4ML ~~LOC~~ SOLN
40.0000 mg | SUBCUTANEOUS | Status: DC
  Administered 2018-12-17 – 2018-12-21 (×5): 40 mg via SUBCUTANEOUS
  Filled 2018-12-17 (×5): qty 0.4

## 2018-12-17 MED ORDER — POLYETHYLENE GLYCOL POWD: 17.0000 g | Freq: Every day | Status: DC

## 2018-12-17 MED ORDER — IPRATROPIUM-ALBUTEROL 0.5-2.5 (3) MG/3ML IN SOLN
3.0000 mL | Freq: Four times a day (QID) | RESPIRATORY_TRACT | Status: DC
  Administered 2018-12-18 – 2018-12-19 (×5): 3 mL via RESPIRATORY_TRACT
  Filled 2018-12-17 (×5): qty 3

## 2018-12-17 MED ORDER — INSULIN ASPART 100 UNIT/ML ~~LOC~~ SOLN: 0.0000 [IU] | Freq: Every day | SUBCUTANEOUS | Status: DC

## 2018-12-17 MED ORDER — SODIUM CHLORIDE 0.9 % IV SOLN
1.0000 g | INTRAVENOUS | Status: DC
  Administered 2018-12-17 – 2018-12-19 (×3): 1 g via INTRAVENOUS
  Filled 2018-12-17: qty 1
  Filled 2018-12-17: qty 10
  Filled 2018-12-17: qty 1
  Filled 2018-12-17: qty 10

## 2018-12-17 MED ORDER — INSULIN ASPART 100 UNIT/ML ~~LOC~~ SOLN
0.0000 [IU] | Freq: Three times a day (TID) | SUBCUTANEOUS | Status: DC
  Administered 2018-12-18: 8 [IU] via SUBCUTANEOUS
  Administered 2018-12-18 – 2018-12-19 (×2): 3 [IU] via SUBCUTANEOUS
  Administered 2018-12-19: 8 [IU] via SUBCUTANEOUS
  Administered 2018-12-20: 5 [IU] via SUBCUTANEOUS
  Administered 2018-12-21 – 2018-12-22 (×3): 2 [IU] via SUBCUTANEOUS
  Filled 2018-12-17 (×8): qty 1

## 2018-12-17 MED ORDER — SODIUM CHLORIDE 0.9% FLUSH: 3.0000 mL | INTRAVENOUS | Status: DC | PRN

## 2018-12-17 MED ORDER — SENNA 8.6 MG PO TABS
1.0000 | ORAL_TABLET | Freq: Two times a day (BID) | ORAL | Status: DC
  Administered 2018-12-17 – 2018-12-22 (×10): 8.6 mg via ORAL
  Filled 2018-12-17 (×10): qty 1

## 2018-12-17 MED ORDER — VANCOMYCIN HCL 10 G IV SOLR: 1250.0000 mg | INTRAVENOUS | Status: DC

## 2018-12-17 MED ORDER — METHYLPREDNISOLONE SODIUM SUCC 125 MG IJ SOLR
60.0000 mg | Freq: Two times a day (BID) | INTRAMUSCULAR | Status: DC
  Administered 2018-12-17 – 2018-12-18 (×2): 60 mg via INTRAVENOUS
  Filled 2018-12-17 (×3): qty 2

## 2018-12-17 NOTE — ED Notes (Signed)
Attempted to call report x 1  

## 2018-12-17 NOTE — H&P (Signed)
[facility] - [place] at [facility]   PATIENT NAME: Desiree Espinoza    MR#:  [id number]  DATE OF BIRTH:  05/10/1956  DATE OF ADMISSION:  12/17/2018  PRIMARY CARE PHYSICIAN: Neita Goodnight Presley Raddle, MD   REQUESTING/REFERRING PHYSICIAN: Dr. Lenard Lance  CHIEF COMPLAINT:   Chief Complaint  Patient presents with  . Fever  . Altered Mental Status    HISTORY OF PRESENT ILLNESS:  Desiree Espinoza  is a 63 y.o. female with a known history of multiple sclerosis, diabetes, hypertension presents to the emergency room brought in from local nursing home due to confusion, weakness cough and myalgias.  Patient is confused unable to contribute to history.  History obtained from reviewing old records and discussing with daughter and husband at bedside.  Patient has been complaining of pain all over.  Here patient found to have UTI and influenza A.  Chest x-ray clear.  Temperature 100.5.  PAST MEDICAL HISTORY:   Past Medical History:  Diagnosis Date  . Choledocholithiasis 01/11/2018  . Diabetes mellitus without complication (HCC)   . Hyperlipemia   . MS (multiple sclerosis) (HCC)   . Neuromuscular disorder (HCC)   . Thyroid disease     PAST SURGICAL HISTORY:   Past Surgical History:  Procedure Laterality Date  . CHOLECYSTECTOMY N/A 02/05/2018   Procedure: LAPAROSCOPIC CHOLECYSTECTOMY;  Surgeon: Henrene Dodge, MD;  Location: [facility] ORS;  Service: General;  Laterality: N/A;  . ENDOSCOPIC RETROGRADE CHOLANGIOPANCREATOGRAPHY (ERCP) WITH PROPOFOL N/A 01/12/2018   Procedure: ENDOSCOPIC RETROGRADE CHOLANGIOPANCREATOGRAPHY (ERCP) WITH PROPOFOL;  Surgeon: Midge Minium, MD;  Location: [facility] ENDOSCOPY;  Service: Endoscopy;  Laterality: N/A;    SOCIAL HISTORY:   Social History   Tobacco Use  . Smoking status: Never Smoker  . Smokeless tobacco: Never Used  Substance Use Topics  . Alcohol use: No    Frequency: Never    FAMILY HISTORY:   Family History  Problem Relation Age of Onset   . Diabetes Mother   . Throat cancer Father   . Breast cancer Sister   . Breast cancer Sister     DRUG ALLERGIES:  No Known Allergies  REVIEW OF SYSTEMS:   Review of Systems  Unable to perform ROS: Mental status change    MEDICATIONS AT HOME:   Prior to Admission medications   Medication Sig Start Date End Date Taking? Authorizing Provider  acetaminophen (TYLENOL) 325 MG tablet Take 2 tablets (650 mg total) by mouth every 6 (six) hours as needed for mild pain (or Fever >/= 101). 10/30/18  Yes Gouru, Deanna Artis, MD  atorvastatin (LIPITOR) 40 MG tablet Take 40 mg by mouth at bedtime.  12/19/17  Yes [provider]  bisacodyl (DULCOLAX) 5 MG EC tablet Take 5 mg by mouth daily as needed for moderate constipation.   Yes [provider]  diclofenac sodium (VOLTAREN) 1 % GEL Apply 2 g topically 4 (four) times daily as needed for pain.   Yes [provider]  docusate sodium (COLACE) 100 MG capsule Take 100 mg by mouth 2 (two) times daily.   Yes [provider]  glipiZIDE (GLUCOTROL XL) 2.5 MG 24 hr tablet Take 2.5 mg by mouth daily with breakfast.   Yes [provider]  metFORMIN (GLUCOPHAGE) 1000 MG tablet Take 1,000 mg by mouth 2 (two) times daily with a meal.   Yes [provider]  Multiple Vitamin (MULTI-VITAMIN DAILY) TABS Take 1 tablet by mouth daily.   Yes [provider]  ondansetron (ZOFRAN) 4 MG tablet  Take 1 tablet (4 mg total) by mouth every 6 (six) hours as needed for nausea. 10/30/18  Yes Gouru, Deanna Artis, MD  Polyethylene Glycol POWD Take 17 g by mouth daily. 02/19/18  Yes Lattie Haw, MD     VITAL SIGNS:  Blood pressure 122/72, pulse (!) 137, temperature (!) 102.5 F (39.2 C), temperature source Rectal, resp. rate (!) 24, height 5\' 4"  (1.626 m), weight 63.5 kg, SpO2 98 %.  PHYSICAL EXAMINATION:  Physical Exam  GENERAL:  63 y.o.-year-old patient lying in the bed , restless EYES: Pupils equal, round, reactive to  light and accommodation. No scleral icterus. Extraocular muscles intact.  HEENT: Head atraumatic, normocephalic. Oropharynx and nasopharynx clear. No oropharyngeal erythema, dry oral mucosa  NECK:  Supple, no jugular venous distention. No thyroid enlargement, no tenderness.  LUNGS: Bilateral wheezing CARDIOVASCULAR: S1, S2 normal. No murmurs, rubs, or gallops.  ABDOMEN: Soft, nontender, nondistended. Bowel sounds present. No organomegaly or mass.  EXTREMITIES: No pedal edema, cyanosis, or clubbing. + 2 pedal & radial pulses b/l.   PSYCHIATRIC: The patient is awake and alert.  Confused SKIN: No obvious rash, lesion, or ulcer.   LABORATORY PANEL:   CBC Recent Labs  Lab 12/17/18 1344  WBC 5.9  HGB 13.9  HCT 43.1  PLT 304   ------------------------------------------------------------------------------------------------------------------  Chemistries  Recent Labs  Lab 12/17/18 1344  NA 135  K 4.0  CL 103  CO2 22  GLUCOSE 138*  BUN 8  CREATININE 0.41*  CALCIUM 9.1  AST 29  ALT 20  ALKPHOS 73  BILITOT 0.6   ------------------------------------------------------------------------------------------------------------------  Cardiac Enzymes No results for input(s): TROPONINI in the last 168 hours. ------------------------------------------------------------------------------------------------------------------  RADIOLOGY:  Dg Chest Port 1 View  Result Date: 12/17/2018 CLINICAL DATA:  Fever for 2 days. EXAM: PORTABLE CHEST 1 VIEW COMPARISON:  None. FINDINGS: The cardiac silhouette, mediastinal and hilar contours are normal. Low lung volumes with vascular crowding. Streaky left basilar atelectasis versus scarring versus infiltrate. The right lung is clear. No pleural effusions. The bony thorax is intact. IMPRESSION: 1. Low lung volumes with vascular crowding. 2. Streaky left basilar scarring versus atelectasis versus developing infiltrate. Electronically Signed   By: Rudie Meyer  M.D.   On: 12/17/2018 14:22     IMPRESSION AND PLAN:   *Sepsis with UTI Start IV ceftriaxone.  Urine cultures and blood culture sent. Check bladder scan to rule out retention  *Influenza a with bronchitis Start Tamiflu and IV steroids.  Nebulizers.  Not needing oxygen.  No infiltrates on chest x-ray.  *Acute metabolic encephalopathy over dementia.  Due to acute illness.  Monitor for inpatient delirium.  *Multiple sclerosis.  Patient is bedbound.  Continue home medications.  DVT prophylaxis with Lovenox  All the records are reviewed and case discussed with ED provider. Management plans discussed with the patient, family and they are in agreement.  CODE STATUS: Full CODE  TOTAL TIME TAKING CARE OF THIS PATIENT: 40 minutes.   Molinda Bailiff Venisa Frampton M.D on 12/17/2018 at 4:10 PM  Between 7am to 6pm - Pager - [phone]  After 6pm go to [email or web address] - password [email or web address] [facility]  [facility]  Office  [phone]  CC: Primary care physician; Preston Fleeting, MD  Note: This dictation was prepared with Dragon dictation along with smaller phrase technology. Any transcriptional errors that result from this process are unintentional.

## 2018-12-17 NOTE — ED Notes (Signed)
ED TO INPATIENT HANDOFF REPORT  Name/Age/Gender Desiree Espinoza 63 y.o. female  Code Status    Code Status Orders  (From admission, onward)         Start     Ordered   12/17/18 1532  Full code  Continuous     12/17/18 1532        Code Status History    Date Active Date Inactive Code Status Order ID Comments User Context   10/28/2018 1525 10/31/2018 1858 Full Code [id number]  Katha Hamming, MD ED   01/11/2018 2155 01/13/2018 1758 Full Code [id number]  Auburn Bilberry, MD Inpatient      Home/SNF/Other Skilled nursing facility [facility]  Chief Complaint fever  Level of Care/Admitting Diagnosis ED Disposition    ED Disposition Condition Comment   Admit  Hospital Area: [facility] [[id number]]  Level of Care: Med-Surg [16]  Diagnosis: Influenza [782956]  Admitting Physician: Milagros Loll [[id number]]  Attending Physician: Milagros Loll [[id number]]  Estimated length of stay: past midnight tomorrow  Certification:: I certify this patient will need inpatient services for at least 2 midnights  PT Class (Do Not Modify): Inpatient [101]  PT Acc Code (Do Not Modify): Private [1]       Medical History Past Medical History:  Diagnosis Date  . Choledocholithiasis 01/11/2018  . Diabetes mellitus without complication (HCC)   . Hyperlipemia   . MS (multiple sclerosis) (HCC)   . Neuromuscular disorder (HCC)   . Thyroid disease     Allergies No Known Allergies  IV Location/Drains/Wounds Patient Lines/Drains/Airways Status   Active Line/Drains/Airways    Name:   Placement date:   Placement time:   Site:   Days:   Peripheral IV 12/17/18 Right Forearm   12/17/18    1405    Forearm   less than 1   Peripheral IV 12/17/18 Left Hand   12/17/18    1422    Hand   less than 1   Urethral Catheter Alivia, RN Straight-tip 16 Fr.   10/28/18    1443    Straight-tip   50   Incision - 4 Ports Abdomen 1: Umbilicus 2: Right;Lateral 3: Right;Lateral 4: Medial    02/05/18    1339     315          Labs/Imaging Results for orders placed or performed during the hospital encounter of 12/17/18 (from the past 48 hour(s))  Lactic acid, plasma     Status: Abnormal   Collection Time: 12/17/18  1:44 PM  Result Value Ref Range   Lactic Acid, Venous 2.5 (HH) 0.5 - 1.9 mmol/L    Comment: CRITICAL RESULT CALLED TO, READ BACK BY AND VERIFIED WITH ALLIE RILEY AT 1443 12/17/2018 DAS Performed at [facility], [address] [place], [place] [address]   Comprehensive metabolic panel     Status: Abnormal   Collection Time: 12/17/18  1:44 PM  Result Value Ref Range   Sodium 135 135 - 145 mmol/L   Potassium 4.0 3.5 - 5.1 mmol/L   Chloride 103 98 - 111 mmol/L   CO2 22 22 - 32 mmol/L   Glucose, Bld 138 (H) 70 - 99 mg/dL   BUN 8 6 - 20 mg/dL   Creatinine, Ser 8.41 (L) 0.44 - 1.00 mg/dL   Calcium 9.1 8.9 - 32.4 mg/dL   Total Protein 7.6 6.5 - 8.1 g/dL   Albumin 4.0 3.5 - 5.0 g/dL   AST 29 15 - 41 U/L  ALT 20 0 - 44 U/L   Alkaline Phosphatase 73 38 - 126 U/L   Total Bilirubin 0.6 0.3 - 1.2 mg/dL   GFR calc non Af Amer >60 >60 mL/min   GFR calc Af Amer >60 >60 mL/min   Anion gap 10 5 - 15    Comment: Performed at [facility], [address] [place], [place] [address]  CBC with Differential     Status: None   Collection Time: 12/17/18  1:44 PM  Result Value Ref Range   WBC 5.9 4.0 - 10.5 K/uL   RBC 5.07 3.87 - 5.11 MIL/uL   Hemoglobin 13.9 12.0 - 15.0 g/dL   HCT 60.443.1 54.036.0 - 98.146.0 %   MCV 85.0 80.0 - 100.0 fL   MCH 27.4 26.0 - 34.0 pg   MCHC 32.3 30.0 - 36.0 g/dL   RDW 19.113.7 47.811.5 - 29.515.5 %   Platelets 304 150 - 400 K/uL   nRBC 0.0 0.0 - 0.2 %   Neutrophils Relative % 70 %   Neutro Abs 4.1 1.7 - 7.7 K/uL   Lymphocytes Relative 18 %   Lymphs Abs 1.1 0.7 - 4.0 K/uL   Monocytes Relative 12 %   Monocytes Absolute 0.7 0.1 - 1.0 K/uL   Eosinophils Relative 0 %   Eosinophils Absolute 0.0 0.0 - 0.5 K/uL   Basophils Relative 0 %    Basophils Absolute 0.0 0.0 - 0.1 K/uL   Immature Granulocytes 0 %   Abs Immature Granulocytes 0.01 0.00 - 0.07 K/uL    Comment: Performed at [facility], [address] [place], [place] [address]  Urinalysis, Complete w Microscopic     Status: Abnormal   Collection Time: 12/17/18  1:44 PM  Result Value Ref Range   Color, Urine AMBER (A) YELLOW    Comment: BIOCHEMICALS MAY BE AFFECTED BY COLOR   APPearance CLOUDY (A) CLEAR   Specific Gravity, Urine 1.017 1.005 - 1.030   pH 6.0 5.0 - 8.0   Glucose, UA NEGATIVE NEGATIVE mg/dL   Hgb urine dipstick SMALL (A) NEGATIVE   Bilirubin Urine NEGATIVE NEGATIVE   Ketones, ur 5 (A) NEGATIVE mg/dL   Protein, ur 30 (A) NEGATIVE mg/dL   Nitrite NEGATIVE NEGATIVE   Leukocytes,Ua LARGE (A) NEGATIVE   RBC / HPF 0-5 0 - 5 RBC/hpf   WBC, UA >50 (H) 0 - 5 WBC/hpf   Bacteria, UA RARE (A) NONE SEEN   Squamous Epithelial / LPF NONE SEEN 0 - 5   WBC Clumps PRESENT    Mucus PRESENT     Comment: Performed at [facility], [address] [place], [place] [address]  Influenza panel by PCR (type A & B)     Status: Abnormal   Collection Time: 12/17/18  2:23 PM  Result Value Ref Range   Influenza A By PCR POSITIVE (A) NEGATIVE   Influenza B By PCR NEGATIVE NEGATIVE    Comment: (NOTE) The Xpert Xpress Flu assay is intended as an aid in the diagnosis of  influenza and should not be used as a sole basis for treatment.  This  assay is FDA approved for nasopharyngeal swab specimens only. Nasal  washings and aspirates are unacceptable for Xpert Xpress Flu testing. Performed at [facility], [address] [place], [place] [address]   Lactic acid, plasma     Status: Abnormal   Collection Time: 12/17/18  3:46 PM  Result Value Ref Range   Lactic Acid, Venous 2.1 (HH)  0.5 - 1.9 mmol/L    Comment: CRITICAL RESULT CALLED TO, READ BACK BY AND VERIFIED WITH JEANETTE PEREZ 12/17/18 @ 1625  MLK Performed at [facility], [address], [place], [place] [address]    Dg Chest [place] 1 View  Result Date: 12/17/2018 CLINICAL DATA:  Fever for 2 days. EXAM: PORTABLE CHEST 1 VIEW COMPARISON:  None. FINDINGS: The cardiac silhouette, mediastinal and hilar contours are normal. Low lung volumes with vascular crowding. Streaky left basilar atelectasis versus scarring versus infiltrate. The right lung is clear. No pleural effusions. The bony thorax is intact. IMPRESSION: 1. Low lung volumes with vascular crowding. 2. Streaky left basilar scarring versus atelectasis versus developing infiltrate. Electronically Signed   By: Rudie Meyer M.D.   On: 12/17/2018 14:22    Pending Labs Unresulted Labs (From admission, onward)    Start     Ordered   12/24/18 0500  Creatinine, serum  (enoxaparin (LOVENOX)    CrCl >/= 30 ml/min)  Weekly,   STAT    Comments:  while on enoxaparin therapy    12/17/18 1532   12/18/18 0500  Basic metabolic panel  Tomorrow morning,   STAT     12/17/18 1532   12/18/18 0500  CBC  Tomorrow morning,   STAT     12/17/18 1532   12/17/18 1532  Hemoglobin A1c  Add-on,   AD    Comments:  To assess prior glycemic control    12/17/18 1532   12/17/18 1357  Blood Culture (routine x 2)  BLOOD CULTURE X 2,   STAT     12/17/18 1356   12/17/18 1357  Urine culture  ONCE - STAT,   STAT     12/17/18 1356          Vitals/Pain Today's Vitals   12/17/18 1333 12/17/18 1341 12/17/18 1448 12/17/18 1500  BP:  126/80  122/72  Pulse:  (!) 126  (!) 137  Resp:  20  (!) 24  Temp:  (!) 102.5 F (39.2 C)    TempSrc:  Rectal    SpO2:  95%  98%  Weight: 63.5 kg     Height: 5\' 4"  (1.626 m)     PainSc:   2      Isolation Precautions Droplet precaution  Medications Medications  metroNIDAZOLE (FLAGYL) IVPB 500 mg (0 mg Intravenous Stopped 12/17/18 1523)  insulin aspart (novoLOG) injection 0-15 Units (has no administration in time range)  insulin aspart (novoLOG) injection 0-5 Units (has no administration in time range)   enoxaparin (LOVENOX) injection 40 mg (has no administration in time range)  acetaminophen (TYLENOL) tablet 650 mg (has no administration in time range)    Or  acetaminophen (TYLENOL) suppository 650 mg (has no administration in time range)  polyethylene glycol (MIRALAX / GLYCOLAX) packet 17 g (has no administration in time range)  ondansetron (ZOFRAN) tablet 4 mg (has no administration in time range)    Or  ondansetron (ZOFRAN) injection 4 mg (has no administration in time range)  albuterol (PROVENTIL) (2.5 MG/3ML) 0.083% nebulizer solution 2.5 mg (has no administration in time range)  senna (SENOKOT) tablet 8.6 mg (has no administration in time range)  methylPREDNISolone sodium succinate (SOLU-MEDROL) 125 mg/2 mL injection 60 mg (has no administration in time range)  oseltamivir (TAMIFLU) capsule 75 mg (has no administration in time range)  cefTRIAXone (ROCEPHIN) 1 g in sodium chloride 0.9 % 100 mL IVPB (has no administration in time range)  ipratropium-albuterol (DUONEB) 0.5-2.5 (3) MG/3ML  nebulizer solution 3 mL (has no administration in time range)  ceFEPIme (MAXIPIME) 2 g in sodium chloride 0.9 % 100 mL IVPB (0 g Intravenous Stopped 12/17/18 1442)  sodium chloride 0.9 % bolus 1,000 mL (1,000 mLs Intravenous New Bag/Given 12/17/18 1421)  vancomycin (VANCOCIN) 1,500 mg in sodium chloride 0.9 % 500 mL IVPB (1,500 mg Intravenous New Bag/Given 12/17/18 1442)  ketorolac (TORADOL) 30 MG/ML injection 15 mg (15 mg Intravenous Given 12/17/18 1435)  sodium chloride 0.9 % bolus 1,000 mL (1,000 mLs Intravenous New Bag/Given 12/17/18 1620)    Mobility non-ambulatory

## 2018-12-17 NOTE — Progress Notes (Signed)
Hypoglycemic Event  CBG: 53  @1825   Treatment: 4 oz Orange Juice Symptoms: None, awake, alert, chatting Follow-up CBG: Time:1846 CBG Result:81  Possible Reasons for Event: No intake while in ED Comments/MD notified:     Sheron Nightingale

## 2018-12-17 NOTE — ED Notes (Signed)
Date and time results received: 12/17/18  2:47 PM   Test: lactic  Critical Value: 2.5   Name of Provider Notified: Dr. Lenard Lance

## 2018-12-17 NOTE — ED Provider Notes (Addendum)
[facility] Emergency Department Provider Note  Time seen: 1:57 PM  I have reviewed the triage vital signs and the nursing notes. Spanish interpreter used for this evaluation.  HISTORY  Chief Complaint Fever and Altered Mental Status    HPI Desiree Espinoza is a 63 y.o. female with a past medical history of diabetes, MS, hyperlipidemia, dementia, presents to the emergency department for fever and increased confusion.  According to the family yesterday patient was vomiting with a mild cough.  Today they found the patient to be more confused than baseline.  Patient does have dementia at baseline, but they state today she is much more confused, found to be febrile in the emergency department greater than 102.  Patient unable to contribute to her history or review of systems.   Past Medical History:  Diagnosis Date  . Choledocholithiasis 01/11/2018  . Diabetes mellitus without complication (HCC)   . Hyperlipemia   . MS (multiple sclerosis) (HCC)   . Neuromuscular disorder (HCC)   . Thyroid disease     Patient Active Problem List   Diagnosis Date Noted  . UTI (urinary tract infection) 10/28/2018  . 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 CHOLECYSTECTOMY N/A 02/05/2018   Procedure: LAPAROSCOPIC CHOLECYSTECTOMY;  Surgeon: Henrene Dodge, MD;  Location: [facility] ORS;  Service: General;  Laterality: N/A;  . ENDOSCOPIC RETROGRADE CHOLANGIOPANCREATOGRAPHY (ERCP) WITH PROPOFOL N/A 01/12/2018   Procedure: ENDOSCOPIC RETROGRADE CHOLANGIOPANCREATOGRAPHY (ERCP) WITH PROPOFOL;  Surgeon: Midge Minium, MD;  Location: [facility] ENDOSCOPY;  Service: Endoscopy;  Laterality: N/A;    Prior to Admission medications   Medication Sig Start Date End Date Taking? Authorizing Provider  acetaminophen (TYLENOL) 325 MG tablet Take 2 tablets (650 mg total) by mouth every 6 (six) hours as needed for mild pain (or Fever >/= 101).  10/30/18   Gouru, Deanna Artis, MD  atorvastatin (LIPITOR) 40 MG tablet Take 40 mg by mouth at bedtime.  12/19/17   [provider]  cefdinir (OMNICEF) 300 MG capsule Take 1 capsule (300 mg total) by mouth every 12 (twelve) hours. 10/30/18   Gouru, Deanna Artis, MD  glipiZIDE (GLUCOTROL) 10 MG tablet Take 10 mg by mouth 2 (two) times daily. 12/19/17   [provider]  metFORMIN (GLUCOPHAGE) 1000 MG tablet Take 1,000 mg by mouth 2 (two) times daily with a meal.    [provider]  ondansetron (ZOFRAN) 4 MG tablet Take 1 tablet (4 mg total) by mouth every 6 (six) hours as needed for nausea. 10/30/18   Ramonita Lab, MD  Polyethylene Glycol POWD Take 17 g by mouth daily. Patient not taking: Reported on 10/28/2018 02/19/18   Lattie Haw, MD    No Known Allergies  Family History  Problem Relation Age of Onset  . Diabetes Mother   . Throat cancer Father   . Breast cancer Sister   . Breast cancer Sister     Social History Social History   Tobacco Use  . Smoking status: Never Smoker  . Smokeless tobacco: Never Used  Substance Use Topics  . Alcohol use: No    Frequency: Never  . Drug use: No    Review of Systems Unable to obtain an accurate/adequate review of systems secondary to altered mental status/dementia  ____________________________________________   PHYSICAL EXAM:  VITAL SIGNS: ED Triage Vitals [12/17/18 1333]  Enc Vitals Group     BP      Pulse      Resp  Temp      Temp src      SpO2      Weight 140 lb (63.5 kg)     Height 5\' 4"  (1.626 m)     Head Circumference      [facility] Flow      Pain Score      Pain Loc      Pain Edu?      Excl. in GC?    Constitutional: Awake and alert, unable to answer questions.  Does follow basic commands such as taking deep breaths. Eyes: Normal exam ENT   Head: Normocephalic and atraumatic   Mouth/Throat: Mucous membranes are moist. Cardiovascular: Normal rate, regular rhythm.  Respiratory: Normal  respiratory effort without tachypnea nor retractions. Breath sounds are clear Gastrointestinal: Soft, moderate suprapubic tenderness on my exam.  No rebound guarding or distention. Musculoskeletal: Nontender with normal range of motion in all extremities.  Neurologic:  Normal speech and language. No gross focal neurologic deficits  Skin:  Skin is warm, dry and intact.  Psychiatric: Mood and affect are normal.   ____________________________________________    EKG  EKG viewed and interpreted by myself shows sinus tachycardia 125 bpm with a narrow QRS, normal axis, normal intervals, nonspecific ST changes. ____________________________________________    RADIOLOGY  Chest x-ray shows atelectasis versus infiltrate  ____________________________________________   INITIAL IMPRESSION / ASSESSMENT AND PLAN / ED COURSE  Pertinent labs & imaging results that were available during my care of the patient were reviewed by me and considered in my medical decision making (see chart for details).  Patient presents to the emergency department for altered mental status found to be febrile to 102 meeting sepsis criteria.  Differential as time would include sepsis, pneumonia, urinary tract infection.  We will check labs, blood cultures, influenza screen, chest x-ray.  We will start on broad-spectrum antibiotics, IV fluids and continue to closely monitor.  Family agreeable.  Patient's urinalysis is positive for urinary tract infection with white blood cell clumps.  Lactic acidosis of 2.5.  Urine culture has been sent.  We will admit to the hospital service for ongoing treatment.  CRITICAL CARE Performed by: Minna Antis   Total critical care time: 30 minutes  Critical care time was exclusive of separately billable procedures and treating other patients.  Critical care was necessary to treat or prevent imminent or life-threatening deterioration.  Critical care was time spent personally by me on  the following activities: development of treatment plan with patient and/or surrogate as well as nursing, discussions with consultants, evaluation of patient's response to treatment, examination of patient, obtaining history from patient or surrogate, ordering and performing treatments and interventions, ordering and review of laboratory studies, ordering and review of radiographic studies, pulse oximetry and re-evaluation of patient's condition.   ____________________________________________   FINAL CLINICAL IMPRESSION(S) / ED DIAGNOSES  Sepsis Altered mental status Urinary tract infection   Minna Antis, MD 12/17/18 1501    Minna Antis, MD 12/17/18 1525

## 2018-12-17 NOTE — ED Triage Notes (Signed)
Pt in via [facility] from [facility].  Pt with hx of MS; is a permanent resident at [facility].  Per EMS, pt with fever x 2 days, given 500mg  Tylenol per facility prior to arrival.  Pt with some confusion, A/O to self only, unsure of her cognitive baseline at this time.

## 2018-12-17 NOTE — Consult Note (Signed)
Pharmacy Antibiotic Note  Desiree Espinoza is a 63 y.o. female admitted on 12/17/2018 with sepsis.  Pharmacy has been consulted for cefepime and vancomycin dosing.  Plan: Cefepime 2 gm IV every 8 hours   Loading Dose: Vancomycin 1500 mg IV once Vancomycin 1250 mg IV Q 24 hrs. Goal AUC 400-550. Expected AUC: 471.4 SCr used: 0.8  Will order a Serum creatinine for tomorrow to track renal function for possible dosage adjustment.  Height: 5\' 4"  (162.6 cm) Weight: 140 lb (63.5 kg) IBW/kg (Calculated) : 54.7  Temp (24hrs), Avg:102.5 F (39.2 C), Min:102.5 F (39.2 C), Max:102.5 F (39.2 C)  Recent Labs  Lab 12/17/18 1344  WBC 5.9  CREATININE 0.41*  LATICACIDVEN 2.5*    Estimated Creatinine Clearance: 64.6 mL/min (A) (by C-G formula based on SCr of 0.41 mg/dL (L)).    No Known Allergies  Antimicrobials this admission: Cefepime 2/13 >>  Vanco 2/13 >>  Metronidazole 2/13 >>  Dose adjustments this admission:   Microbiology results: 2/13 BCx: pending 2/13 UCx: pending    Thank you for allowing pharmacy to be a part of this patient's care.  Orinda Kenner, PharmD Clinical Pharmacist 12/17/2018 3:26 PM

## 2018-12-17 NOTE — Progress Notes (Signed)
Admitted from the ED with Flu, Bronchitis, UTI, and possible Sepsis.  Pt has history of dementia and has a child-like way of talking and interacting. Afeb., BP stable.  HR 103.  No c/o pain.  Has head congestion.

## 2018-12-17 NOTE — Progress Notes (Signed)
CODE SEPSIS - PHARMACY COMMUNICATION  **Broad Spectrum Antibiotics should be administered within 1 hour of Sepsis diagnosis**  Time Code Sepsis Called/Page Received: 1401  Antibiotics Ordered: Cefepime, metronidazole, and Vancomycin  Time of 1st antibiotic administration: 1412  Additional action taken by pharmacy: n/a  If necessary, Name of Provider/Nurse Contacted: n/a    Orinda Kenner ,PharmD Clinical Pharmacist  12/17/2018  2:01 PM

## 2018-12-17 NOTE — Progress Notes (Signed)
Advance care planning  Purpose of Encounter Sepsis, UTI, influenza, CODE STATUS discussion  Parties in Attendance Patient, husband and daughter at bedside  Patients Decisional capacity Patient with dementia and presently more confused due to UTI and influenza.  Husband is the healthcare power of attorney.  We discussed regarding patient's acute illness and need for admission.  Treatment and prognosis discussed.  CODE STATUS discussed and husband requests aggressive care and full CODE STATUS.  Orders entered.  CODE STATUS changed.  FULL CODE  Time spent - 17 minutes

## 2018-12-18 ENCOUNTER — Ambulatory Visit: Payer: Medicare HMO

## 2018-12-18 LAB — CBC
HCT: 39.1 % (ref 36.0–46.0)
Hemoglobin: 12.6 g/dL (ref 12.0–15.0)
MCH: 27.5 pg (ref 26.0–34.0)
MCHC: 32.2 g/dL (ref 30.0–36.0)
MCV: 85.4 fL (ref 80.0–100.0)
Platelets: 272 10*3/uL (ref 150–400)
RBC: 4.58 MIL/uL (ref 3.87–5.11)
RDW: 13.3 % (ref 11.5–15.5)
WBC: 4.2 10*3/uL (ref 4.0–10.5)
nRBC: 0 % (ref 0.0–0.2)

## 2018-12-18 LAB — BASIC METABOLIC PANEL
Anion gap: 4 — ABNORMAL LOW (ref 5–15)
BUN: 7 mg/dL (ref 6–20)
CO2: 24 mmol/L (ref 22–32)
Calcium: 8.1 mg/dL — ABNORMAL LOW (ref 8.9–10.3)
Chloride: 108 mmol/L (ref 98–111)
Creatinine, Ser: 0.38 mg/dL — ABNORMAL LOW (ref 0.44–1.00)
GFR calc Af Amer: >60 mL/min (ref >60)
GFR calc non Af Amer: >60 mL/min (ref >60)
GLUCOSE: 177 mg/dL — AB (ref 70–99)
Potassium: 3.4 mmol/L — ABNORMAL LOW (ref 3.5–5.1)
Sodium: 136 mmol/L (ref 135–145)

## 2018-12-18 LAB — GLUCOSE, CAPILLARY
Glucose-Capillary: 101 mg/dL — ABNORMAL HIGH (ref 70–99)
Glucose-Capillary: 180 mg/dL — ABNORMAL HIGH (ref 70–99)
Glucose-Capillary: 282 mg/dL — ABNORMAL HIGH (ref 70–99)
Glucose-Capillary: 193 mg/dL — ABNORMAL HIGH (ref 70–99)

## 2018-12-18 LAB — HEMOGLOBIN A1C
Hgb A1c MFr Bld: 5.9 % — ABNORMAL HIGH (ref 4.8–5.6)
MEAN PLASMA GLUCOSE: 123 mg/dL

## 2018-12-18 MED ORDER — METHYLPREDNISOLONE SODIUM SUCC 125 MG IJ SOLR
60.0000 mg | Freq: Every day | INTRAMUSCULAR | Status: DC
  Administered 2018-12-19: 60 mg via INTRAVENOUS
  Filled 2018-12-18: qty 2

## 2018-12-18 MED ORDER — SODIUM CHLORIDE 0.9 % IV SOLN: INTRAVENOUS | Status: DC | PRN

## 2018-12-18 MED ORDER — POTASSIUM CHLORIDE CRYS ER 20 MEQ PO TBCR
40.0000 meq | EXTENDED_RELEASE_TABLET | Freq: Once | ORAL | Status: AC
  Administered 2018-12-18: 40 meq via ORAL
  Filled 2018-12-18: qty 2

## 2018-12-18 NOTE — NC FL2 (Addendum)
[place] MEDICAID FL2 LEVEL OF CARE SCREENING TOOL     IDENTIFICATION  Patient Name: Desiree Espinoza Birthdate: April 19, 1956 Sex: female Admission Date (Current Location): 12/17/2018  [place] and [place] Number:  [address] [id number] Facility and Address:  [facility], [address] [place], [place] [address]      Provider Number: [id number]  Attending Physician Name and Address:  Houston Siren, MD  Relative Name and Phone Number:  Giese,[phone] or Margarita Sermons Daughter   [phone] or Arora, [place] Daughter   [phone]     Current Level of Care: Hospital Recommended Level of Care: Skilled Nursing Facility Prior Approval Number:    Date Approved/Denied:   PASRR Number: [id number]  Discharge Plan: SNF    Current Diagnoses: Patient Active Problem List   Diagnosis Date Noted  . Influenza 12/17/2018  . UTI (urinary tract infection) 10/28/2018  . 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    Orientation RESPIRATION BLADDER Height & Weight     Self  Normal Continent Weight: 131 lb 2.8 oz (59.5 kg) Height:  5\' 4"  (162.6 cm)  BEHAVIORAL SYMPTOMS/MOOD NEUROLOGICAL BOWEL NUTRITION STATUS      Continent Diet(Carb Modified diet)  AMBULATORY STATUS COMMUNICATION OF NEEDS Skin   Extensive Assist Verbally Normal                       Personal Care Assistance Level of Assistance  Bathing, Feeding, Dressing Bathing Assistance: Maximum assistance Feeding assistance: Limited assistance Dressing Assistance: Maximum assistance     Functional Limitations Info  Hearing, Sight, Speech Sight Info: Adequate Hearing Info: Adequate Speech Info: Adequate    SPECIAL CARE FACTORS FREQUENCY                       Contractures      Additional Factors Info  Code Status, Allergies, Isolation Precautions, Insulin Sliding Scale Code Status Info: Full Code Allergies Info: NKA   Insulin Sliding Scale  Info: insulin aspart (novoLOG) injection 0-15 Units 3x a day with meals Isolation Precautions Info: Droplet Precautions     Current Medications (12/18/2018):  This is the current hospital active medication list Current Facility-Administered Medications  Medication Dose Route Frequency Provider Last Rate Last Dose  . 0.9 %  sodium chloride infusion   Intravenous PRN Houston Siren, MD      . acetaminophen (TYLENOL) tablet 650 mg  650 mg Oral Q6H PRN Milagros Loll, MD   650 mg at 12/18/18 0857   Or  . acetaminophen (TYLENOL) suppository 650 mg  650 mg Rectal Q6H PRN Sudini, Wardell Heath, MD      . albuterol (PROVENTIL) (2.5 MG/3ML) 0.083% nebulizer solution 2.5 mg  2.5 mg Nebulization Q2H PRN Sudini, Srikar, MD      . atorvastatin (LIPITOR) tablet 40 mg  40 mg Oral QHS Milagros Loll, MD   40 mg at 12/17/18 2139  . cefTRIAXone (ROCEPHIN) 1 g in sodium chloride 0.9 % 100 mL IVPB  1 g Intravenous Q24H Sudini, Wardell Heath, MD 200 mL/hr at 12/17/18 2100 1 g at 12/17/18 2100  . enoxaparin (LOVENOX) injection 40 mg  40 mg Subcutaneous Q24H Milagros Loll, MD   40 mg at 12/17/18 2138  . glipiZIDE (GLUCOTROL XL) 24 hr tablet 2.5 mg  2.5 mg Oral Q breakfast Milagros Loll, MD   2.5 mg at 12/18/18 0854  . insulin aspart (novoLOG) injection 0-15 Units  0-15 Units Subcutaneous TID WC Milagros Loll, MD   8  Units at 12/18/18 1804  . insulin aspart (novoLOG) injection 0-5 Units  0-5 Units Subcutaneous QHS Sudini, Srikar, MD      . ipratropium-albuterol (DUONEB) 0.5-2.5 (3) MG/3ML nebulizer solution 3 mL  3 mL Nebulization QID Houston Siren, MD   3 mL at 12/18/18 1524  . [START ON 12/19/2018] methylPREDNISolone sodium succinate (SOLU-MEDROL) 125 mg/2 mL injection 60 mg  60 mg Intravenous Daily Sainani, Rolly Pancake, MD      . ondansetron (ZOFRAN) tablet 4 mg  4 mg Oral Q6H PRN Milagros Loll, MD       Or  . ondansetron (ZOFRAN) injection 4 mg  4 mg Intravenous Q6H PRN Sudini, Srikar, MD      . oseltamivir (TAMIFLU)  capsule 75 mg  75 mg Oral BID Milagros Loll, MD   75 mg at 12/18/18 0853  . polyethylene glycol (MIRALAX / GLYCOLAX) packet 17 g  17 g Oral Daily PRN Sudini, Srikar, MD      . polyethylene glycol (MIRALAX / GLYCOLAX) packet 17 g  17 g Oral Daily Levi Aland A, RPH   17 g at 12/18/18 0852  . senna (SENOKOT) tablet 8.6 mg  1 tablet Oral BID Milagros Loll, MD   8.6 mg at 12/18/18 0854  . sodium chloride flush (NS) 0.9 % injection 3 mL  3 mL Intravenous Q12H Milagros Loll, MD   3 mL at 12/18/18 0902  . sodium chloride flush (NS) 0.9 % injection 3 mL  3 mL Intravenous PRN Milagros Loll, MD         Discharge Medications: Please see discharge summary for a list of discharge medications.  Relevant Imaging Results:  Relevant Lab Results:   Additional Information ssn: [id number] [place] Constable

## 2018-12-18 NOTE — Progress Notes (Signed)
[place] [facility] [place] NAME: Desiree Espinoza    MR#:  [id number]  DATE OF BIRTH:  Feb 25, 1956  SUBJECTIVE:   Patient presented to the hospital due to fever, altered mental status and noted to be positive for flu and also noted to have urinary tract infection.  Patient feels better today.  Patient's husband is at bedside.  No other acute events overnight.  REVIEW OF SYSTEMS:    Review of Systems  Constitutional: Negative for chills and fever.  HENT: Negative for congestion and tinnitus.   Eyes: Negative for blurred vision and double vision.  Respiratory: Positive for cough. Negative for shortness of breath and wheezing.   Cardiovascular: Negative for chest pain, orthopnea and PND.  Gastrointestinal: Negative for abdominal pain, diarrhea, nausea and vomiting.  Genitourinary: Negative for dysuria and hematuria.  Neurological: Negative for dizziness, sensory change and focal weakness.  All other systems reviewed and are negative.   Nutrition: Carb modified Tolerating Diet: Yes Tolerating PT: bedbound at baseline.     DRUG ALLERGIES:  No Known Allergies  VITALS:  Blood pressure 102/66, pulse (!) 108, temperature (!) 100.9 F (38.3 C), temperature source Axillary, resp. rate 19, height _0  (1.626 m), weight 59.5 kg, SpO2 97 %.  PHYSICAL EXAMINATION:   Physical Exam  GENERAL:  63 y.o.-year-old patient lying in bed in no acute distress.  EYES: Pupils equal, round, reactive to light and accommodation. No scleral icterus. Extraocular muscles intact.  HEENT: Head atraumatic, normocephalic. Oropharynx and nasopharynx clear.  NECK:  Supple, no jugular venous distention. No thyroid enlargement, no tenderness.  LUNGS: Good a/e b/l, no wheezing, rales, rhonchi. No use of accessory muscles of respiration.  CARDIOVASCULAR: S1, S2 normal. No murmurs, rubs, or gallops.  ABDOMEN: Soft, nontender, nondistended. Bowel sounds present. No organomegaly  or mass.  EXTREMITIES: No cyanosis, clubbing or edema b/l.    NEUROLOGIC: Cranial nerves II through XII are intact. No focal Motor or sensory deficits b/l. Contracted LE due to MS.     PSYCHIATRIC: The patient is alert and oriented x 3.  SKIN: No obvious rash, lesion, or ulcer.    LABORATORY PANEL:   CBC Recent Labs  Lab 12/18/18 0517  WBC 4.2  HGB 12.6  HCT 39.1  PLT 272   ------------------------------------------------------------------------------------------------------------------  Chemistries  Recent Labs  Lab 12/17/18 1344 12/18/18 0517  NA 135 136  K 4.0 3.4*  CL 103 108  CO2 22 24  GLUCOSE 138* 177*  BUN 8 7  CREATININE 0.41* 0.38*  CALCIUM 9.1 8.1*  AST 29  --   ALT 20  --   ALKPHOS 73  --   BILITOT 0.6  --    ------------------------------------------------------------------------------------------------------------------  Cardiac Enzymes No results for input(s): TROPONINI in the last 168 hours. ------------------------------------------------------------------------------------------------------------------  RADIOLOGY:  Dg Chest Port 1 View  Result Date: 12/17/2018 CLINICAL DATA:  Fever for 2 days. EXAM: PORTABLE CHEST 1 VIEW COMPARISON:  None. FINDINGS: The cardiac silhouette, mediastinal and hilar contours are normal. Low lung volumes with vascular crowding. Streaky left basilar atelectasis versus scarring versus infiltrate. The right lung is clear. No pleural effusions. The bony thorax is intact. IMPRESSION: 1. Low lung volumes with vascular crowding. 2. Streaky left basilar scarring versus atelectasis versus developing infiltrate. Electronically Signed   By: Marijo Sanes M.D.   On: 12/17/2018 14:22     ASSESSMENT AND PLAN:   63 year old female with past medical history of multiple sclerosis, hyperlipidemia, diabetes, hypothyroidism,  history of UTI who presented to the hospital due to fever, altered mental status.  1.  Sepsis-patient met  criteria given fever, tachycardia, urinalysis positive for UTI and also flu PCR positive. - Continue IV fluids, IV ceftriaxone for the UTI, Tamiflu for the flu.  Follow cultures.  Afebrile, hemodynamically stable today.    2.  Urinary tract infection-source of patient's sepsis. -Continue IV ceftriaxone, follow urine cultures.  Patient has a previous history of Klebsiella UTI.  3.  Flu-patient is positive for influenza A with some mild bronchitis. -Continue Tamiflu, IV steroids, albuterol nebs.  4.  Diabetes type 2 without complication-continue sliding scale insulin, Glucotrol.  Follow blood sugars.  5.  Hyperlipidemia-continue atorvastatin.  6. Hypokalemia - will supplement and repeat in a.m  - check Mg level.   D/c to Long term care when stable.   All the records are reviewed and case discussed with Care Management/Social Worker. Management plans discussed with the patient, family and they are in agreement.  CODE STATUS: Full code  DVT Prophylaxis: Lovenox  TOTAL TIME TAKING CARE OF THIS PATIENT: 30 minutes.   POSSIBLE D/C IN 2-3 DAYS, DEPENDING ON CLINICAL CONDITION.   Henreitta Leber M.D on 12/18/2018 at 2:56 PM  Between 7am to 6pm - Pager - [phone]  After 6pm go to [email or web address] - Proofreader  [facility]  Office  [phone]  CC: Primary care physician; Theotis Burrow, MD

## 2018-12-18 NOTE — Clinical Social Work Note (Addendum)
Clinical Social Work Assessment  Patient Details  Name: Estrellita Levert MRN: [id number] Date of Birth: 07/11/1956  Date of referral:  12/18/18               Reason for consult:  Facility Placement                Permission sought to share information with:  Facility Medical sales representative, Family Supports Permission granted to share information::  Yes, Verbal Permission Granted  Name::     [place],[phone] or Margarita Sermons Daughter   [phone] or Sanyla, Lamberty Daughter   [phone]   Agency::  SNF admissions  Relationship::     Contact Information:     Housing/Transportation Living arrangements for the past 2 months:  Skilled Nursing Facility Source of Information:  Adult Children Patient Interpreter Needed:  None Criminal Activity/Legal Involvement Pertinent to Current Situation/Hospitalization:  No - Comment as needed Significant Relationships:  Adult Children, Spouse Lives with:  Facility Resident Do you feel safe going back to the place where you live?  Yes Need for family participation in patient care:  Yes (Comment)(Patient has dementia)  Care giving concerns: Patient's family does not have any concerns about patient returning back to SNF.   Social Worker assessment / plan:  Patient is a 63 year old female who is a long term care resident at [facility] of [address]  Patient is alert and oriented x1, she has dementia, CSW completed assessment by speaking with her daughter Raquel, because her husband was not available.  Patient has been at [facility] for a couple of months, patient's family have been pleased with the care that was being provided.  Patient's daughter states, they would like her to return back to facility.  CSW explained role of CSW and process for coordinating care to SNF.  CSW explained, that CSW is not sure when she will be ready for discharge and it will depend on what the physician decides.  Patient's daughter expressed understanding  and did not have any other questions or concerns.  CSW was given permission to send information to SNF to facilitate discharge planning.  Employment status:  Disabled (Comment on whether or not currently receiving Disability) Insurance information:  Medicaid [place], [employer] PT Recommendations:  Not assessed at this time Information / Referral to community resources:  Skilled Nursing Facility  Patient/Family's Response to care:  Patient's family are in agreement to having her return back to SNF.  Patient/Family's Understanding of and Emotional Response to Diagnosis, Current Treatment, and Prognosis:  Patient's family are hopeful that she will not have to be in hospital for very long.  Emotional Assessment Appearance:  Appears stated age Attitude/Demeanor/Rapport:    Affect (typically observed):  Appropriate, Stable Orientation:  Oriented to Self Alcohol / Substance use:  Not Applicable Psych involvement (Current and /or in the community):  No (Comment)  Discharge Needs  Concerns to be addressed:  Care Coordination Readmission within the last 30 days:  No Current discharge risk:  None Barriers to Discharge:  Continued Medical Work up   [place] Constable 12/18/2018, 6:27 PM

## 2018-12-19 LAB — GLUCOSE, CAPILLARY
Glucose-Capillary: 57 mg/dL — ABNORMAL LOW (ref 70–99)
Glucose-Capillary: 75 mg/dL (ref 70–99)
Glucose-Capillary: 180 mg/dL — ABNORMAL HIGH (ref 70–99)
Glucose-Capillary: 260 mg/dL — ABNORMAL HIGH (ref 70–99)
Glucose-Capillary: 149 mg/dL — ABNORMAL HIGH (ref 70–99)

## 2018-12-19 LAB — URINE CULTURE: Culture: >=100000 — AB

## 2018-12-19 LAB — BASIC METABOLIC PANEL
Anion gap: 3 — ABNORMAL LOW (ref 5–15)
BUN: 8 mg/dL (ref 6–20)
CO2: 27 mmol/L (ref 22–32)
CREATININE: 0.35 mg/dL — AB (ref 0.44–1.00)
Calcium: 8.4 mg/dL — ABNORMAL LOW (ref 8.9–10.3)
Chloride: 109 mmol/L (ref 98–111)
GFR calc Af Amer: >60 mL/min (ref >60)
GFR calc non Af Amer: >60 mL/min (ref >60)
Glucose, Bld: 69 mg/dL — ABNORMAL LOW (ref 70–99)
Potassium: 3.8 mmol/L (ref 3.5–5.1)
Sodium: 139 mmol/L (ref 135–145)

## 2018-12-19 LAB — MAGNESIUM: Magnesium: 2.1 mg/dL (ref 1.7–2.4)

## 2018-12-19 MED ORDER — IPRATROPIUM-ALBUTEROL 0.5-2.5 (3) MG/3ML IN SOLN
3.0000 mL | Freq: Three times a day (TID) | RESPIRATORY_TRACT | Status: DC
  Administered 2018-12-19 – 2018-12-20 (×3): 3 mL via RESPIRATORY_TRACT
  Filled 2018-12-19 (×3): qty 3

## 2018-12-19 MED ORDER — OSELTAMIVIR PHOSPHATE 75 MG PO CAPS: 75.0000 mg | ORAL_CAPSULE | Freq: Two times a day (BID) | ORAL | 0 refills | Status: DC

## 2018-12-19 MED ORDER — CEPHALEXIN 500 MG PO CAPS: 500.0000 mg | ORAL_CAPSULE | Freq: Three times a day (TID) | ORAL | 0 refills | Status: DC

## 2018-12-19 MED ORDER — BISACODYL 10 MG RE SUPP
10.0000 mg | Freq: Once | RECTAL | Status: AC
  Administered 2018-12-19: 10 mg via RECTAL
  Filled 2018-12-19: qty 1

## 2018-12-19 NOTE — Plan of Care (Signed)
  Problem: Clinical Measurements: Goal: Respiratory complications will improve Outcome: Progressing   

## 2018-12-19 NOTE — Progress Notes (Signed)
Patient ID: Desiree Espinoza, female   DOB: December 05, 1955, 63 y.o.   MRN: [id number]  [facility] PROGRESS NOTE  Desiree Espinoza QDI:[id number] DOB: 1956-02-28 DOA: 12/17/2018 PCP: Preston Fleeting, MD  HPI/Subjective: Patient feeling much better.  Offers no complaints.  No problems breathing.  Objective: Vitals:   12/19/18 1002 12/19/18 1157  BP: (!) 134/123 117/78  Pulse: (!) 102 90  Resp:    Temp: 97.9 F (36.6 C) 97.6 F (36.4 C)  SpO2: 95% 97%    Filed Weights   12/17/18 1333 12/18/18 0504 12/19/18 0707  Weight: 63.5 kg 59.5 kg 60 kg    ROS: Review of Systems  Constitutional: Negative for chills and fever.  Eyes: Negative for blurred vision.  Respiratory: Negative for cough and shortness of breath.   Cardiovascular: Negative for chest pain.  Gastrointestinal: Positive for constipation. Negative for abdominal pain, diarrhea, nausea and vomiting.  Genitourinary: Negative for dysuria.  Musculoskeletal: Negative for joint pain.  Neurological: Negative for dizziness and headaches.   Exam: Physical Exam  Constitutional: She is oriented to person, place, and time.  HENT:  Nose: No mucosal edema.  Mouth/Throat: No oropharyngeal exudate or posterior oropharyngeal edema.  Eyes: Pupils are equal, round, and reactive to light. Conjunctivae, EOM and lids are normal.  Neck: No JVD present. Carotid bruit is not present. No edema present. No thyroid mass and no thyromegaly present.  Cardiovascular: S1 normal and S2 normal. Exam reveals no gallop.  No murmur heard. Pulses:      Dorsalis pedis pulses are 2+ on the right side and 2+ on the left side.  Respiratory: No respiratory distress. She has no wheezes. She has no rhonchi. She has no rales.  GI: Soft. Bowel sounds are normal. There is no abdominal tenderness.  Musculoskeletal:     Left ankle: She exhibits no swelling.  Lymphadenopathy:    She has no cervical adenopathy.  Neurological: She is alert and oriented to  person, place, and time. No cranial nerve deficit.  Difficulty with straight leg raise but able to get her heels up off the bed.  Skin: Skin is warm. No rash noted. Nails show no clubbing.  Psychiatric: She has a normal mood and affect.      Data Reviewed: Basic Metabolic Panel: Recent Labs  Lab 12/17/18 1344 12/18/18 0517 12/19/18 0500  NA 135 136 139  K 4.0 3.4* 3.8  CL 103 108 109  CO2 22 24 27   GLUCOSE 138* 177* 69*  BUN 8 7 8   CREATININE 0.41* 0.38* 0.35*  CALCIUM 9.1 8.1* 8.4*  MG  --   --  2.1   Liver Function Tests: Recent Labs  Lab 12/17/18 1344  AST 29  ALT 20  ALKPHOS 73  BILITOT 0.6  PROT 7.6  ALBUMIN 4.0   CBC: Recent Labs  Lab 12/17/18 1344 12/18/18 0517  WBC 5.9 4.2  NEUTROABS 4.1  --   HGB 13.9 12.6  HCT 43.1 39.1  MCV 85.0 85.4  PLT 304 272    CBG: Recent Labs  Lab 12/18/18 1640 12/18/18 2051 12/19/18 0801 12/19/18 0823 12/19/18 1147  GLUCAP 282* 193* 57* 75 180*    Recent Results (from the past 240 hour(s))  Blood Culture (routine x 2)     Status: None (Preliminary result)   Collection Time: 12/17/18  1:44 PM  Result Value Ref Range Status   Specimen Description BLOOD BLOOD LEFT HAND  Final   Special Requests   Final    BOTTLES DRAWN  AEROBIC AND ANAEROBIC Blood Culture adequate volume   Culture   Final    NO GROWTH 2 DAYS Performed at [facility], [address] [place], [place] [address]    Report Status PENDING  Incomplete  Blood Culture (routine x 2)     Status: None (Preliminary result)   Collection Time: 12/17/18  1:44 PM  Result Value Ref Range Status   Specimen Description BLOOD RIGHT ANTECUBITAL  Final   Special Requests   Final    BOTTLES DRAWN AEROBIC AND ANAEROBIC Blood Culture adequate volume   Culture   Final    NO GROWTH 2 DAYS Performed at [facility], [address], [place], [place] [address]    Report Status PENDING  Incomplete  Urine culture     Status: Abnormal   Collection  Time: 12/17/18  1:44 PM  Result Value Ref Range Status   Specimen Description   Final    URINE, RANDOM Performed at [facility], [address] [place], [place] [address]    Special Requests   Final    NONE Performed at [facility], [address], [place], [place] [address]    Culture >=100,000 COLONIES/mL KLEBSIELLA PNEUMONIAE (A)  Final   Report Status 12/19/2018 FINAL  Final   Organism ID, Bacteria KLEBSIELLA PNEUMONIAE (A)  Final      Susceptibility   Klebsiella pneumoniae - MIC*    AMPICILLIN RESISTANT Resistant     CEFAZOLIN <=4 SENSITIVE Sensitive     CEFTRIAXONE <=1 SENSITIVE Sensitive     CIPROFLOXACIN <=0.25 SENSITIVE Sensitive     GENTAMICIN <=1 SENSITIVE Sensitive     IMIPENEM <=0.25 SENSITIVE Sensitive     NITROFURANTOIN 32 SENSITIVE Sensitive     TRIMETH/SULFA <=20 SENSITIVE Sensitive     AMPICILLIN/SULBACTAM 4 SENSITIVE Sensitive     PIP/TAZO <=4 SENSITIVE Sensitive     Extended ESBL NEGATIVE Sensitive     * >=100,000 COLONIES/mL KLEBSIELLA PNEUMONIAE  MRSA PCR Screening     Status: None   Collection Time: 12/17/18  6:33 PM  Result Value Ref Range Status   MRSA by PCR NEGATIVE NEGATIVE Final    Comment:        The GeneXpert MRSA Assay (FDA approved for NASAL specimens only), is one component of a comprehensive MRSA colonization surveillance program. It is not intended to diagnose MRSA infection nor to guide or monitor treatment for MRSA infections. Performed at [facility], [address] [place], [place] [address]      Scheduled Meds: . atorvastatin  40 mg Oral QHS  . enoxaparin (LOVENOX) injection  40 mg Subcutaneous Q24H  . insulin aspart  0-15 Units Subcutaneous TID WC  . insulin aspart  0-5 Units Subcutaneous QHS  . ipratropium-albuterol  3 mL Nebulization TID  . oseltamivir  75 mg Oral BID  . polyethylene glycol  17 g Oral Daily  . senna  1 tablet Oral BID  . sodium chloride flush  3 mL Intravenous Q12H    Continuous Infusions: . sodium chloride    . cefTRIAXone (ROCEPHIN)  IV 1 g (12/18/18 2117)    Assessment/Plan:  1. Clinical sepsis on presentation with fever tachycardia.  Patient found to have influenza A positive and positive urinary tract infection.  Currently afebrile. 2. Influenza A positive on Tamiflu.  Discontinue steroids. 3. Urinary tract infection with Klebsiella.  On Rocephin.  Will switch over to Keflex upon discharge. 4. Multiple sclerosis and weakness.  [facility]  They were unable to take the patient back today because they need a private room with the influenza. 5. Hyperlipidemia unspecified on atorvastatin 6. Type 2 diabetes with relative hypoglycemia this morning.  Hold glipizide.  Can go back on Glucophage as outpatient.  Currently on sliding scale here.  Code Status:     Code Status Orders  (From admission, onward)         Start     Ordered   12/17/18 1532  Full code  Continuous     12/17/18 1532        Code Status History    Date Active Date Inactive Code Status Order ID Comments User Context   10/28/2018 1525 10/31/2018 1858 Full Code [id number]  Katha Hamming, MD ED   01/11/2018 2155 01/13/2018 1758 Full Code [id number]  Auburn Bilberry, MD Inpatient     Family Communication: Husband and daughter at bedside Disposition Plan: Back to [facility] once bed available.  Antibiotics:  Rocephin  Tamiflu  Time spent: 28 minutes  Desiree Espinoza [employer]

## 2018-12-19 NOTE — Progress Notes (Signed)
Blood sugar this morning was 57, patient had juice and a snack.  It went up to 75.  She was asymptomatic.   Patient denies pain and is very friendly/happy.  RN gave report and transferred patient to a new Charity fundraiser.  Christean Grief, RN

## 2018-12-20 LAB — GLUCOSE, CAPILLARY
GLUCOSE-CAPILLARY: 67 mg/dL — AB (ref 70–99)
Glucose-Capillary: 72 mg/dL (ref 70–99)
Glucose-Capillary: 228 mg/dL — ABNORMAL HIGH (ref 70–99)
Glucose-Capillary: 99 mg/dL (ref 70–99)
Glucose-Capillary: 117 mg/dL — ABNORMAL HIGH (ref 70–99)

## 2018-12-20 MED ORDER — CEPHALEXIN 500 MG PO CAPS
500.0000 mg | ORAL_CAPSULE | Freq: Three times a day (TID) | ORAL | Status: DC
  Administered 2018-12-20 – 2018-12-22 (×6): 500 mg via ORAL
  Filled 2018-12-20 (×6): qty 1

## 2018-12-20 MED ORDER — IPRATROPIUM-ALBUTEROL 0.5-2.5 (3) MG/3ML IN SOLN
3.0000 mL | Freq: Two times a day (BID) | RESPIRATORY_TRACT | Status: DC
  Administered 2018-12-20 – 2018-12-22 (×4): 3 mL via RESPIRATORY_TRACT
  Filled 2018-12-20 (×4): qty 3

## 2018-12-20 MED ORDER — METFORMIN HCL 500 MG PO TABS
1000.0000 mg | ORAL_TABLET | Freq: Two times a day (BID) | ORAL | Status: DC
  Administered 2018-12-20 – 2018-12-22 (×4): 1000 mg via ORAL
  Filled 2018-12-20 (×4): qty 2

## 2018-12-20 NOTE — Progress Notes (Signed)
Patient ID: Desiree Espinoza, female   DOB: 07/30/1956, 63 y.o.   MRN: [id number]  [facility] PROGRESS NOTE  Brinna Rosasco ZDG:[id number] DOB: [birth year] DOA: 12/17/2018 PCP: Preston Fleeting, MD  HPI/Subjective: Patient feels okay.  Offers no complaints.  Objective: Vitals:   12/20/18 0410 12/20/18 0815  BP: 110/74 118/62  Pulse: 93 85  Resp: 18   Temp:  (!) 97.5 F (36.4 C)  SpO2: 96% 97%    Filed Weights   12/18/18 0504 12/19/18 0707 12/20/18 0439  Weight: 59.5 kg 60 kg 57.9 kg    ROS: Review of Systems  Constitutional: Negative for chills and fever.  Eyes: Negative for blurred vision.  Respiratory: Negative for cough and shortness of breath.   Cardiovascular: Negative for chest pain.  Gastrointestinal: Negative for abdominal pain, constipation, diarrhea, nausea and vomiting.  Genitourinary: Negative for dysuria.  Musculoskeletal: Negative for joint pain.  Neurological: Negative for dizziness and headaches.   Exam: Physical Exam  Constitutional: She is oriented to person, place, and time.  HENT:  Nose: No mucosal edema.  Mouth/Throat: No oropharyngeal exudate or posterior oropharyngeal edema.  Eyes: Pupils are equal, round, and reactive to light. Conjunctivae, EOM and lids are normal.  Neck: No JVD present. Carotid bruit is not present. No edema present. No thyroid mass and no thyromegaly present.  Cardiovascular: S1 normal and S2 normal. Exam reveals no gallop.  No murmur heard. Pulses:      Dorsalis pedis pulses are 2+ on the right side and 2+ on the left side.  Respiratory: No respiratory distress. She has decreased breath sounds in the right lower field and the left lower field. She has no wheezes. She has no rhonchi. She has no rales.  GI: Soft. Bowel sounds are normal. There is no abdominal tenderness.  Musculoskeletal:     Left ankle: She exhibits no swelling.  Lymphadenopathy:    She has no cervical adenopathy.  Neurological: She is alert and  oriented to person, place, and time. No cranial nerve deficit.  Difficulty with straight leg raise but able to get her heels up off the bed.  Skin: Skin is warm. No rash noted. Nails show no clubbing.  Psychiatric: She has a normal mood and affect.      Data Reviewed: Basic Metabolic Panel: Recent Labs  Lab 12/17/18 1344 12/18/18 0517 12/19/18 0500  NA 135 136 139  K 4.0 3.4* 3.8  CL 103 108 109  CO2 22 24 27   GLUCOSE 138* 177* 69*  BUN 8 7 8   CREATININE 0.41* 0.38* 0.35*  CALCIUM 9.1 8.1* 8.4*  MG  --   --  2.1   Liver Function Tests: Recent Labs  Lab 12/17/18 1344  AST 29  ALT 20  ALKPHOS 73  BILITOT 0.6  PROT 7.6  ALBUMIN 4.0   CBC: Recent Labs  Lab 12/17/18 1344 12/18/18 0517  WBC 5.9 4.2  NEUTROABS 4.1  --   HGB 13.9 12.6  HCT 43.1 39.1  MCV 85.0 85.4  PLT 304 272    CBG: Recent Labs  Lab 12/19/18 1622 12/19/18 2044 12/20/18 0814 12/20/18 0833 12/20/18 1124  GLUCAP 260* 149* 67* 72 228*    Recent Results (from the past 240 hour(s))  Blood Culture (routine x 2)     Status: None (Preliminary result)   Collection Time: 12/17/18  1:44 PM  Result Value Ref Range Status   Specimen Description BLOOD BLOOD LEFT HAND  Final   Special Requests   Final  BOTTLES DRAWN AEROBIC AND ANAEROBIC Blood Culture adequate volume   Culture   Final    NO GROWTH 3 DAYS Performed at [facility], [address] [place], [place] [address]    Report Status PENDING  Incomplete  Blood Culture (routine x 2)     Status: None (Preliminary result)   Collection Time: 12/17/18  1:44 PM  Result Value Ref Range Status   Specimen Description BLOOD RIGHT ANTECUBITAL  Final   Special Requests   Final    BOTTLES DRAWN AEROBIC AND ANAEROBIC Blood Culture adequate volume   Culture   Final    NO GROWTH 3 DAYS Performed at [facility], [address] [place], [place] [address]    Report Status PENDING  Incomplete  Urine culture     Status: Abnormal    Collection Time: 12/17/18  1:44 PM  Result Value Ref Range Status   Specimen Description   Final    URINE, RANDOM Performed at [facility], [address] [place], [place] [address]    Special Requests   Final    NONE Performed at [facility], [address] [place], [place] [address]    Culture >=100,000 COLONIES/mL KLEBSIELLA PNEUMONIAE (A)  Final   Report Status 12/19/2018 FINAL  Final   Organism ID, Bacteria KLEBSIELLA PNEUMONIAE (A)  Final      Susceptibility   Klebsiella pneumoniae - MIC*    AMPICILLIN RESISTANT Resistant     CEFAZOLIN <=4 SENSITIVE Sensitive     CEFTRIAXONE <=1 SENSITIVE Sensitive     CIPROFLOXACIN <=0.25 SENSITIVE Sensitive     GENTAMICIN <=1 SENSITIVE Sensitive     IMIPENEM <=0.25 SENSITIVE Sensitive     NITROFURANTOIN 32 SENSITIVE Sensitive     TRIMETH/SULFA <=20 SENSITIVE Sensitive     AMPICILLIN/SULBACTAM 4 SENSITIVE Sensitive     PIP/TAZO <=4 SENSITIVE Sensitive     Extended ESBL NEGATIVE Sensitive     * >=100,000 COLONIES/mL KLEBSIELLA PNEUMONIAE  MRSA PCR Screening     Status: None   Collection Time: 12/17/18  6:33 PM  Result Value Ref Range Status   MRSA by PCR NEGATIVE NEGATIVE Final    Comment:        The GeneXpert MRSA Assay (FDA approved for NASAL specimens only), is one component of a comprehensive MRSA colonization surveillance program. It is not intended to diagnose MRSA infection nor to guide or monitor treatment for MRSA infections. Performed at [facility], [address] [place], [place] [address]      Scheduled Meds: . atorvastatin  40 mg Oral QHS  . enoxaparin (LOVENOX) injection  40 mg Subcutaneous Q24H  . insulin aspart  0-15 Units Subcutaneous TID WC  . insulin aspart  0-5 Units Subcutaneous QHS  . ipratropium-albuterol  3 mL Nebulization TID  . oseltamivir  75 mg Oral BID  . polyethylene glycol  17 g Oral Daily  . senna  1 tablet Oral BID  . sodium chloride flush  3 mL  Intravenous Q12H   Continuous Infusions: . sodium chloride    . cefTRIAXone (ROCEPHIN)  IV Stopped (12/19/18 2148)    Assessment/Plan:  1. Clinical sepsis on presentation with fever tachycardia.  Patient found to have influenza A positive and positive urinary tract infection.  Currently afebrile.  Clinically improved 2. Influenza A positive on Tamiflu. 3. Urinary tract infection with Klebsiella.  Switch over to Keflex for this evening.  Received Rocephin since admission. 4. Multiple sclerosis and weakness.  Patient  will go back to [facility].  They were unable to take the patient back today. 5. Hyperlipidemia unspecified on atorvastatin 6. Type 2 diabetes.  Hold glipizide.  Restart metformin.  Code Status:     Code Status Orders  (From admission, onward)         Start     Ordered   12/17/18 1532  Full code  Continuous     12/17/18 1532        Code Status History    Date Active Date Inactive Code Status Order ID Comments User Context   10/28/2018 1525 10/31/2018 1858 Full Code [id number]  Katha Hamming, MD ED   01/11/2018 2155 01/13/2018 1758 Full Code [id number]  Auburn Bilberry, MD Inpatient     Family Communication: Husband at bedside Disposition Plan: Back to [facility] once private room bed available.  Likely that this will happen tomorrow.  Antibiotics:  Keflex  Tamiflu  Time spent: 26 minutes  Gwenivere Hiraldo [employer]

## 2018-12-20 NOTE — Progress Notes (Signed)
CBG was 67; went to 72 after juice and a snack  Christean Grief, RN

## 2018-12-20 NOTE — Clinical Social Work Note (Signed)
CSW contacted Tammy from [facility] who stated that no private rooms are available until tomorrow. The patient needs a private room due to droplet precautions. CSW is following.  Desiree Espinoza, MSW, [facility] Majors [phone]

## 2018-12-20 NOTE — Progress Notes (Signed)
Patient accidentally pulled her IV out this morning.  MD said NOT to insert a new IV.  Dr. Renae Gloss stated that she was fine for disharge, so she does not need a new IV.  He said he would change her medicines from IV to PO.  Christean Grief, RN

## 2018-12-21 LAB — GLUCOSE, CAPILLARY
Glucose-Capillary: 99 mg/dL (ref 70–99)
Glucose-Capillary: 142 mg/dL — ABNORMAL HIGH (ref 70–99)
Glucose-Capillary: 118 mg/dL — ABNORMAL HIGH (ref 70–99)
Glucose-Capillary: 111 mg/dL — ABNORMAL HIGH (ref 70–99)

## 2018-12-21 MED ORDER — IPRATROPIUM-ALBUTEROL 0.5-2.5 (3) MG/3ML IN SOLN: 3.0000 mL | Freq: Two times a day (BID) | RESPIRATORY_TRACT | 0 refills | Status: DC

## 2018-12-21 MED ORDER — OSELTAMIVIR PHOSPHATE 75 MG PO CAPS: 75.0000 mg | ORAL_CAPSULE | Freq: Two times a day (BID) | ORAL | 0 refills | Status: DC

## 2018-12-21 MED ORDER — CEPHALEXIN 500 MG PO CAPS: 500.0000 mg | ORAL_CAPSULE | Freq: Three times a day (TID) | ORAL | 0 refills | Status: DC

## 2018-12-21 NOTE — Discharge Summary (Addendum)
[facility] [place] [facility]   PATIENT NAME: Desiree Espinoza    MR#:  [id number]  DATE OF BIRTH:  October 01, 1956  DATE OF ADMISSION:  12/17/2018 ADMITTING PHYSICIAN: Milagros Loll, MD  DATE OF DISCHARGE: 12/22/2018  PRIMARY CARE PHYSICIAN: Preston Fleeting, MD    ADMISSION DIAGNOSIS:  Lower urinary tract infectious disease [N39.0] Influenza A [J10.1] Urinary tract infection without hematuria, site unspecified [N39.0]  DISCHARGE DIAGNOSIS:  Active Problems:   Influenza   SECONDARY DIAGNOSIS:   Past Medical History:  Diagnosis Date  . Choledocholithiasis 01/11/2018  . Diabetes mellitus without complication (HCC)   . Hyperlipemia   . MS (multiple sclerosis) (HCC)   . Neuromuscular disorder (HCC)   . Thyroid disease     HOSPITAL COURSE:   1.  Clinical sepsis on presentation with fever and tachycardia.  The patient was found to have influenza A positive and positive for urinary tract infection.  Patient is currently breathing comfortably and afebrile.  Clinically improved. 2.  Influenza A positive.  Finished Tamiflu.  Can give DuoNeb nebulizer twice a day.  Likely can stop this in a week or 2. 3.  Urinary tract infection with Klebsiella.  Switched over to Keflex.  Another 5 doses of Keflex. 4.  Multiple sclerosis and weakness.  Patient will go back to [facility]. 5.  Hyperlipidemia unspecified on atorvastatin 6.  Type 2 diabetes mellitus.  I restarted metformin.  Can hold glipizide at this time since the patient did have an episode of relative hypoglycemia here.  Check fingersticks q. before meals and nightly.  DISCHARGE CONDITIONS:   Satisfactory  CONSULTS OBTAINED:  None  DRUG ALLERGIES:  No Known Allergies  DISCHARGE MEDICATIONS:   Allergies as of 12/22/2018   No Known Allergies     Medication List    STOP taking these medications   glipiZIDE 2.5 MG 24 hr tablet Commonly known as:  GLUCOTROL XL     TAKE these medications    acetaminophen 325 MG tablet Commonly known as:  TYLENOL Take 2 tablets (650 mg total) by mouth every 6 (six) hours as needed for mild pain (or Fever >/= 101).   atorvastatin 40 MG tablet Commonly known as:  LIPITOR Take 40 mg by mouth at bedtime.   bisacodyl 5 MG EC tablet Commonly known as:  DULCOLAX Take 5 mg by mouth daily as needed for moderate constipation.   cephALEXin 500 MG capsule Commonly known as:  KEFLEX Take 1 capsule (500 mg total) by mouth 3 (three) times daily for 5 doses.   diclofenac sodium 1 % Gel Commonly known as:  VOLTAREN Apply 2 g topically 4 (four) times daily as needed for pain.   docusate sodium 100 MG capsule Commonly known as:  COLACE Take 100 mg by mouth 2 (two) times daily.   ipratropium-albuterol 0.5-2.5 (3) MG/3ML Soln Commonly known as:  DUONEB Take 3 mLs by nebulization 2 (two) times daily.   metFORMIN 1000 MG tablet Commonly known as:  GLUCOPHAGE Take 1,000 mg by mouth 2 (two) times daily with a meal.   MULTI-VITAMIN DAILY Tabs Take 1 tablet by mouth daily.   ondansetron 4 MG tablet Commonly known as:  ZOFRAN Take 1 tablet (4 mg total) by mouth every 6 (six) hours as needed for nausea.   Polyethylene Glycol Powd Take 17 g by mouth daily.        DISCHARGE INSTRUCTIONS:   Follow-up team at [facility]  If you experience worsening of your admission  symptoms, develop shortness of breath, life threatening emergency, suicidal or homicidal thoughts you must seek medical attention immediately by calling 911 or calling your MD immediately  if symptoms less severe.  You Must read complete instructions/literature along with all the possible adverse reactions/side effects for all the Medicines you take and that have been prescribed to you. Take any new Medicines after you have completely understood and accept all the possible adverse reactions/side effects.   Please note  You were cared for by a hospitalist during your hospital  stay. If you have any questions about your discharge medications or the care you received while you were in the hospital after you are discharged, you can call the unit and asked to speak with the hospitalist on call if the hospitalist that took care of you is not available. Once you are discharged, your primary care physician will handle any further medical issues. Please note that NO REFILLS for any discharge medications will be authorized once you are discharged, as it is imperative that you return to your primary care physician (or establish a relationship with a primary care physician if you do not have one) for your aftercare needs so that they can reassess your need for medications and monitor your lab values.    Today   CHIEF COMPLAINT:   Chief Complaint  Patient presents with  . Fever  . Altered Mental Status    HISTORY OF PRESENT ILLNESS:  Desiree Espinoza  is a 63 y.o. female presented with fever and altered mental status   VITAL SIGNS:  Blood pressure 118/70, pulse (!) 105, temperature 98.4 F (36.9 C), temperature source Oral, resp. rate 16, height 5\' 4"  (1.626 m), weight 58.5 kg, SpO2 96 %.   PHYSICAL EXAMINATION:  GENERAL:  63 y.o.-year-old patient lying in the bed with no acute distress.  EYES: Pupils equal, round, reactive to light and accommodation. No scleral icterus. Extraocular muscles intact.  HEENT: Head atraumatic, normocephalic. Oropharynx and nasopharynx clear.  NECK:  Supple, no jugular venous distention. No thyroid enlargement, no tenderness.  LUNGS: Decreased breath sounds bilaterally, no wheezing, rales,rhonchi or crepitation. No use of accessory muscles of respiration.  CARDIOVASCULAR: S1, S2 normal. No murmurs, rubs, or gallops.  ABDOMEN: Soft, non-tender, non-distended. Bowel sounds present. No organomegaly or mass.  EXTREMITIES: No pedal edema, cyanosis, or clubbing.  NEUROLOGIC: Cranial nerves II through XII are intact. Muscle strength 5/5 in all  extremities. Sensation intact. Gait not checked.  PSYCHIATRIC: The patient is alert and answers questions appropriately.  SKIN: No obvious rash, lesion, or ulcer.   DATA REVIEW:   CBC Recent Labs  Lab 12/18/18 0517  WBC 4.2  HGB 12.6  HCT 39.1  PLT 272    Chemistries  Recent Labs  Lab 12/17/18 1344  12/19/18 0500  NA 135   < > 139  K 4.0   < > 3.8  CL 103   < > 109  CO2 22   < > 27  GLUCOSE 138*   < > 69*  BUN 8   < > 8  CREATININE 0.41*   < > 0.35*  CALCIUM 9.1   < > 8.4*  MG  --   --  2.1  AST 29  --   --   ALT 20  --   --   ALKPHOS 73  --   --   BILITOT 0.6  --   --    < > = values in this interval not displayed.  Microbiology Results  Results for orders placed or performed during the hospital encounter of 12/17/18  Blood Culture (routine x 2)     Status: None   Collection Time: 12/17/18  1:44 PM  Result Value Ref Range Status   Specimen Description BLOOD BLOOD LEFT HAND  Final   Special Requests   Final    BOTTLES DRAWN AEROBIC AND ANAEROBIC Blood Culture adequate volume   Culture   Final    NO GROWTH 5 DAYS Performed at [facility], [address] [place], [place] [address]    Report Status 12/22/2018 FINAL  Final  Blood Culture (routine x 2)     Status: None   Collection Time: 12/17/18  1:44 PM  Result Value Ref Range Status   Specimen Description BLOOD RIGHT ANTECUBITAL  Final   Special Requests   Final    BOTTLES DRAWN AEROBIC AND ANAEROBIC Blood Culture adequate volume   Culture   Final    NO GROWTH 5 DAYS Performed at [facility], [address], [place], [place] [address]    Report Status 12/22/2018 FINAL  Final  Urine culture     Status: Abnormal   Collection Time: 12/17/18  1:44 PM  Result Value Ref Range Status   Specimen Description   Final    URINE, RANDOM Performed at [facility], [address], [place], [place] [address]    Special Requests   Final    NONE Performed at [facility],  [address] [place], [place] [address]    Culture >=100,000 COLONIES/mL KLEBSIELLA PNEUMONIAE (A)  Final   Report Status 12/19/2018 FINAL  Final   Organism ID, Bacteria KLEBSIELLA PNEUMONIAE (A)  Final      Susceptibility   Klebsiella pneumoniae - MIC*    AMPICILLIN RESISTANT Resistant     CEFAZOLIN <=4 SENSITIVE Sensitive     CEFTRIAXONE <=1 SENSITIVE Sensitive     CIPROFLOXACIN <=0.25 SENSITIVE Sensitive     GENTAMICIN <=1 SENSITIVE Sensitive     IMIPENEM <=0.25 SENSITIVE Sensitive     NITROFURANTOIN 32 SENSITIVE Sensitive     TRIMETH/SULFA <=20 SENSITIVE Sensitive     AMPICILLIN/SULBACTAM 4 SENSITIVE Sensitive     PIP/TAZO <=4 SENSITIVE Sensitive     Extended ESBL NEGATIVE Sensitive     * >=100,000 COLONIES/mL KLEBSIELLA PNEUMONIAE  MRSA PCR Screening     Status: None   Collection Time: 12/17/18  6:33 PM  Result Value Ref Range Status   MRSA by PCR NEGATIVE NEGATIVE Final    Comment:        The GeneXpert MRSA Assay (FDA approved for NASAL specimens only), is one component of a comprehensive MRSA colonization surveillance program. It is not intended to diagnose MRSA infection nor to guide or monitor treatment for MRSA infections. Performed at [facility], [address], [place], [place] [address]      Management plans discussed with the patient, family (husband yesterday) and they are in agreement.  CODE STATUS:     Code Status Orders  (From admission, onward)         Start     Ordered   12/17/18 1532  Full code  Continuous     12/17/18 1532        Code Status History    Date Active Date Inactive Code Status Order ID Comments User Context   10/28/2018 1525 10/31/2018 1858 Full Code [id number]  Katha Hamming, MD ED   01/11/2018 2155 01/13/2018 1758 Full Code  [id number]  Auburn Bilberry, MD Inpatient      TOTAL TIME TAKING CARE OF THIS PATIENT: 32 minutes.    Alford Highland M.D on 12/22/2018 at 7:42 AM  Between 7am to 6pm - Pager -  [phone]  After 6pm go to [email or web address] - password [email or web address]  [facility] Office  [phone]  CC: Primary care physician; Preston Fleeting, MD

## 2018-12-21 NOTE — Clinical Social Work Note (Signed)
CSW just received phone call from [facility] saying they are not able to accept patient until tomorrow, because they do not have a private room available per their policy for patient who tested positive for the flu and she has not completed her doses of theraflu.  CSW was informed they can accept patient tomorrow though.  Ervin Knack. Codee Tutson, MSW, [facility]ors [phone]  12/21/2018 3:03 PM

## 2018-12-21 NOTE — Progress Notes (Signed)
Patient ID: Desiree Espinoza, female   DOB: February 27, 1956, 63 y.o.   MRN: [id number]  [employer] PROGRESS NOTE  Desiree Espinoza FWY:[id number] DOB: [birth year] DOA: 12/17/2018 PCP: Preston Fleeting, MD  HPI/Subjective: [facility] got notified [facility] today.  Objective: Vitals:   12/21/18 0502 12/21/18 0814  BP: 108/78 (!) 88/55  Pulse: (!) 105 (!) 105  Resp: 16 16  Temp: 97.7 F (36.5 C) (!) 97.4 F (36.3 C)  SpO2: 94% 97%    Filed Weights   12/19/18 0707 12/20/18 0439 12/21/18 0502  Weight: 60 kg 57.9 kg 54.6 kg    ROS: Review of Systems  Constitutional: Negative for chills and fever.  Eyes: Negative for blurred vision.  Respiratory: Negative for cough and shortness of breath.   Cardiovascular: Negative for chest pain.  Gastrointestinal: Negative for abdominal pain, constipation, diarrhea, nausea and vomiting.  Genitourinary: Negative for dysuria.  Musculoskeletal: Negative for joint pain.  Neurological: Negative for dizziness and headaches.   Exam: Physical Exam  Constitutional: She is oriented to person, place, and time.  HENT:  Nose: No mucosal edema.  Mouth/Throat: No oropharyngeal exudate or posterior oropharyngeal edema.  Eyes: Pupils are equal, round, and reactive to light. Conjunctivae, EOM and lids are normal.  Neck: No JVD present. Carotid bruit is not present. No edema present. No thyroid mass and no thyromegaly present.  Cardiovascular: S1 normal and S2 normal. Exam reveals no gallop.  No murmur heard. Pulses:      Dorsalis pedis pulses are 2+ on the right side and 2+ on the left side.  Respiratory: No respiratory distress. She has decreased breath sounds in the right lower field and the left lower field. She has no wheezes. She has no rhonchi. She has no rales.  GI: Soft. Bowel sounds are normal. There is no abdominal tenderness.  Musculoskeletal:     Left ankle: She exhibits no swelling.  Lymphadenopathy:    She has no  cervical adenopathy.  Neurological: She is alert and oriented to person, place, and time. No cranial nerve deficit.  Difficulty with straight leg raise but able to get her heels up off the bed.  Skin: Skin is warm. No rash noted. Nails show no clubbing.  Psychiatric: She has a normal mood and affect.      Data Reviewed: Basic Metabolic Panel: Recent Labs  Lab 12/17/18 1344 12/18/18 0517 12/19/18 0500  NA 135 136 139  K 4.0 3.4* 3.8  CL 103 108 109  CO2 22 24 27   GLUCOSE 138* 177* 69*  BUN 8 7 8   CREATININE 0.41* 0.38* 0.35*  CALCIUM 9.1 8.1* 8.4*  MG  --   --  2.1   Liver Function Tests: Recent Labs  Lab 12/17/18 1344  AST 29  ALT 20  ALKPHOS 73  BILITOT 0.6  PROT 7.6  ALBUMIN 4.0   CBC: Recent Labs  Lab 12/17/18 1344 12/18/18 0517  WBC 5.9 4.2  NEUTROABS 4.1  --   HGB 13.9 12.6  HCT 43.1 39.1  MCV 85.0 85.4  PLT 304 272    CBG: Recent Labs  Lab 12/20/18 1124 12/20/18 1715 12/20/18 2159 12/21/18 0828 12/21/18 1226  GLUCAP 228* 99 117* 99 142*    Recent Results (from the past 240 hour(s))  Blood Culture (routine x 2)     Status: None (Preliminary result)   Collection Time: 12/17/18  1:44 PM  Result Value Ref Range Status   Specimen Description BLOOD BLOOD LEFT  HAND  Final   Special Requests   Final    BOTTLES DRAWN AEROBIC AND ANAEROBIC Blood Culture adequate volume   Culture   Final    NO GROWTH 4 DAYS Performed at [facility], [address] [place], [place] [address]    Report Status PENDING  Incomplete  Blood Culture (routine x 2)     Status: None (Preliminary result)   Collection Time: 12/17/18  1:44 PM  Result Value Ref Range Status   Specimen Description BLOOD RIGHT ANTECUBITAL  Final   Special Requests   Final    BOTTLES DRAWN AEROBIC AND ANAEROBIC Blood Culture adequate volume   Culture   Final    NO GROWTH 4 DAYS Performed at [facility], [address], [place], [place] [address]    Report Status  PENDING  Incomplete  Urine culture     Status: Abnormal   Collection Time: 12/17/18  1:44 PM  Result Value Ref Range Status   Specimen Description   Final    URINE, RANDOM Performed at [facility], [address], [place], [place] [address]    Special Requests   Final    NONE Performed at [facility], [address] [place], [place] [address]    Culture >=100,000 COLONIES/mL KLEBSIELLA PNEUMONIAE (A)  Final   Report Status 12/19/2018 FINAL  Final   Organism ID, Bacteria KLEBSIELLA PNEUMONIAE (A)  Final      Susceptibility   Klebsiella pneumoniae - MIC*    AMPICILLIN RESISTANT Resistant     CEFAZOLIN <=4 SENSITIVE Sensitive     CEFTRIAXONE <=1 SENSITIVE Sensitive     CIPROFLOXACIN <=0.25 SENSITIVE Sensitive     GENTAMICIN <=1 SENSITIVE Sensitive     IMIPENEM <=0.25 SENSITIVE Sensitive     NITROFURANTOIN 32 SENSITIVE Sensitive     TRIMETH/SULFA <=20 SENSITIVE Sensitive     AMPICILLIN/SULBACTAM 4 SENSITIVE Sensitive     PIP/TAZO <=4 SENSITIVE Sensitive     Extended ESBL NEGATIVE Sensitive     * >=100,000 COLONIES/mL KLEBSIELLA PNEUMONIAE  MRSA PCR Screening     Status: None   Collection Time: 12/17/18  6:33 PM  Result Value Ref Range Status   MRSA by PCR NEGATIVE NEGATIVE Final    Comment:        The GeneXpert MRSA Assay (FDA approved for NASAL specimens only), is one component of a comprehensive MRSA colonization surveillance program. It is not intended to diagnose MRSA infection nor to guide or monitor treatment for MRSA infections. Performed at [facility], [address] [place], [place] [address]      Scheduled Meds: . atorvastatin  40 mg Oral QHS  . cephALEXin  500 mg Oral Q8H  . enoxaparin (LOVENOX) injection  40 mg Subcutaneous Q24H  . insulin aspart  0-15 Units Subcutaneous TID WC  . insulin aspart  0-5 Units Subcutaneous QHS  . ipratropium-albuterol  3 mL Nebulization BID  . metFORMIN  1,000 mg Oral BID WC  . oseltamivir   75 mg Oral BID  . polyethylene glycol  17 g Oral Daily  . senna  1 tablet Oral BID  . sodium chloride flush  3 mL Intravenous Q12H   Continuous Infusions: . sodium chloride      Assessment/Plan:  1. Clinical sepsis on presentation with fever tachycardia.  Patient found to have influenza A positive and positive urinary tract infection.  Clinically improved. 2. Influenza A positive on Tamiflu.  Last dose tonight. 3. Urinary tract infection  with Klebsiella.  Switched over to Keflex.  4. Multiple sclerosis and weakness.  5. Hyperlipidemia unspecified on atorvastatin 6. Type 2 diabetes.  Hold glipizide.  Restart metformin.  Code Status:     Code Status Orders  (From admission, onward)         Start     Ordered   12/17/18 1532  Full code  Continuous     12/17/18 1532        Code Status History    Date Active Date Inactive Code Status Order ID Comments User Context   10/28/2018 1525 10/31/2018 1858 Full Code [id number]  Katha Hamming, MD ED   01/11/2018 2155 01/13/2018 1758 Full Code [id number]  Auburn Bilberry, MD Inpatient     Family Communication: Husband yesterday Disposition Plan: Just informed that [facility] will not take the patient [facility] today.  Antibiotics:  Keflex  Tamiflu  Time spent: 35 minutes  Ishaaq Penna [employer]

## 2018-12-22 LAB — CULTURE, BLOOD (ROUTINE X 2)
CULTURE: NO GROWTH
Culture: NO GROWTH
Special Requests: ADEQUATE
Special Requests: ADEQUATE

## 2018-12-22 LAB — GLUCOSE, CAPILLARY
Glucose-Capillary: 100 mg/dL — ABNORMAL HIGH (ref 70–99)
Glucose-Capillary: 124 mg/dL — ABNORMAL HIGH (ref 70–99)

## 2018-12-22 MED ORDER — CEPHALEXIN 500 MG PO CAPS: 500.0000 mg | ORAL_CAPSULE | Freq: Three times a day (TID) | ORAL | 0 refills | Status: AC

## 2018-12-22 NOTE — Progress Notes (Signed)
Patient ID: Desiree Espinoza, female   DOB: [birth year], 63 y.o.   MRN: [id number]  [employer] PROGRESS NOTE  Desiree Espinoza [id number] DOB: 19-Sep-1956 DOA: 12/17/2018 PCP: Preston Fleeting, MD  HPI/Subjective: Patient feels okay.  Some cough.  No fever.  No burning on urination.  Tolerating diet.  Objective: Vitals:   12/22/18 0408 12/22/18 0535  BP: 118/70   Pulse: (!) 116 (!) 105  Resp: 16   Temp: 98.4 F (36.9 C)   SpO2: 96%     Filed Weights   12/20/18 0439 12/21/18 0502 12/22/18 0408  Weight: 57.9 kg 54.6 kg 58.5 kg    ROS: Review of Systems  Constitutional: Negative for chills and fever.  Eyes: Negative for blurred vision.  Respiratory: Positive for cough. Negative for shortness of breath.   Cardiovascular: Negative for chest pain.  Gastrointestinal: Negative for abdominal pain, constipation, diarrhea, nausea and vomiting.  Genitourinary: Negative for dysuria.  Musculoskeletal: Negative for joint pain.  Neurological: Negative for dizziness and headaches.   Exam: Physical Exam  Constitutional: She is oriented to person, place, and time.  HENT:  Nose: No mucosal edema.  Mouth/Throat: No oropharyngeal exudate or posterior oropharyngeal edema.  Eyes: Pupils are equal, round, and reactive to light. Conjunctivae, EOM and lids are normal.  Neck: No JVD present. Carotid bruit is not present. No edema present. No thyroid mass and no thyromegaly present.  Cardiovascular: S1 normal and S2 normal. Exam reveals no gallop.  No murmur heard. Pulses:      Dorsalis pedis pulses are 2+ on the right side and 2+ on the left side.  Respiratory: No respiratory distress. She has decreased breath sounds in the right lower field and the left lower field. She has no wheezes. She has no rhonchi. She has no rales.  GI: Soft. Bowel sounds are normal. There is no abdominal tenderness.  Musculoskeletal:     Left ankle: She exhibits no swelling.  Lymphadenopathy:    She has no  cervical adenopathy.  Neurological: She is alert and oriented to person, place, and time. No cranial nerve deficit.  Difficulty with straight leg raise but able to get her heels up off the bed.  Skin: Skin is warm. No rash noted. Nails show no clubbing.  Psychiatric: She has a normal mood and affect.      Data Reviewed: Basic Metabolic Panel: Recent Labs  Lab 12/17/18 1344 12/18/18 0517 12/19/18 0500  NA 135 136 139  K 4.0 3.4* 3.8  CL 103 108 109  CO2 22 24 27   GLUCOSE 138* 177* 69*  BUN 8 7 8   CREATININE 0.41* 0.38* 0.35*  CALCIUM 9.1 8.1* 8.4*  MG  --   --  2.1   Liver Function Tests: Recent Labs  Lab 12/17/18 1344  AST 29  ALT 20  ALKPHOS 73  BILITOT 0.6  PROT 7.6  ALBUMIN 4.0   CBC: Recent Labs  Lab 12/17/18 1344 12/18/18 0517  WBC 5.9 4.2  NEUTROABS 4.1  --   HGB 13.9 12.6  HCT 43.1 39.1  MCV 85.0 85.4  PLT 304 272    CBG: Recent Labs  Lab 12/20/18 2159 12/21/18 0828 12/21/18 1226 12/21/18 1728 12/21/18 2102  GLUCAP 117* 99 142* 118* 111*    Recent Results (from the past 240 hour(s))  Blood Culture (routine x 2)     Status: None   Collection Time: 12/17/18  1:44 PM  Result Value Ref Range Status   Specimen Description BLOOD BLOOD LEFT HAND  Final   Special Requests   Final    BOTTLES DRAWN AEROBIC AND ANAEROBIC Blood Culture adequate volume   Culture   Final    NO GROWTH 5 DAYS Performed at [facility], [address] [place], [place] [address]    Report Status 12/22/2018 FINAL  Final  Blood Culture (routine x 2)     Status: None   Collection Time: 12/17/18  1:44 PM  Result Value Ref Range Status   Specimen Description BLOOD RIGHT ANTECUBITAL  Final   Special Requests   Final    BOTTLES DRAWN AEROBIC AND ANAEROBIC Blood Culture adequate volume   Culture   Final    NO GROWTH 5 DAYS Performed at [facility], [address] [place], [place] [address]    Report Status 12/22/2018 FINAL  Final  Urine culture      Status: Abnormal   Collection Time: 12/17/18  1:44 PM  Result Value Ref Range Status   Specimen Description   Final    URINE, RANDOM Performed at [facility], [address] [place], [place] [address]    Special Requests   Final    NONE Performed at [facility], [address] [place], [place] [address]    Culture >=100,000 COLONIES/mL KLEBSIELLA PNEUMONIAE (A)  Final   Report Status 12/19/2018 FINAL  Final   Organism ID, Bacteria KLEBSIELLA PNEUMONIAE (A)  Final      Susceptibility   Klebsiella pneumoniae - MIC*    AMPICILLIN RESISTANT Resistant     CEFAZOLIN <=4 SENSITIVE Sensitive     CEFTRIAXONE <=1 SENSITIVE Sensitive     CIPROFLOXACIN <=0.25 SENSITIVE Sensitive     GENTAMICIN <=1 SENSITIVE Sensitive     IMIPENEM <=0.25 SENSITIVE Sensitive     NITROFURANTOIN 32 SENSITIVE Sensitive     TRIMETH/SULFA <=20 SENSITIVE Sensitive     AMPICILLIN/SULBACTAM 4 SENSITIVE Sensitive     PIP/TAZO <=4 SENSITIVE Sensitive     Extended ESBL NEGATIVE Sensitive     * >=100,000 COLONIES/mL KLEBSIELLA PNEUMONIAE  MRSA PCR Screening     Status: None   Collection Time: 12/17/18  6:33 PM  Result Value Ref Range Status   MRSA by PCR NEGATIVE NEGATIVE Final    Comment:        The GeneXpert MRSA Assay (FDA approved for NASAL specimens only), is one component of a comprehensive MRSA colonization surveillance program. It is not intended to diagnose MRSA infection nor to guide or monitor treatment for MRSA infections. Performed at [facility], [address] [place], [place] [address]      Scheduled Meds: . atorvastatin  40 mg Oral QHS  . cephALEXin  500 mg Oral Q8H  . enoxaparin (LOVENOX) injection  40 mg Subcutaneous Q24H  . insulin aspart  0-15 Units Subcutaneous TID WC  . insulin aspart  0-5 Units Subcutaneous QHS  . ipratropium-albuterol  3 mL Nebulization BID  . metFORMIN  1,000 mg Oral BID WC  . polyethylene glycol  17 g Oral Daily  . senna  1  tablet Oral BID  . sodium chloride flush  3 mL Intravenous Q12H   Continuous Infusions: . sodium chloride      Assessment/Plan:  1. Clinical sepsis on presentation with fever tachycardia.  Patient found to have influenza A positive and positive urinary tract infection.  Clinically improved. 2. Influenza A positive.  Finish Tamiflu 3. Urinary tract infection with Klebsiella.  Switched over to Keflex.  Will need 5 more doses  at rehab. 4. Multiple sclerosis and weakness.  5. Hyperlipidemia unspecified on atorvastatin 6. Type 2 diabetes.  Hold glipizide.  Restarted metformin.  Code Status:     Code Status Orders  (From admission, onward)         Start     Ordered   12/17/18 1532  Full code  Continuous     12/17/18 1532        Code Status History    Date Active Date Inactive Code Status Order ID Comments User Context   10/28/2018 1525 10/31/2018 1858 Full Code [id number]  Katha Hamming, MD ED   01/11/2018 2155 01/13/2018 1758 Full Code [id number]  Auburn Bilberry, MD Inpatient     Family Communication: Daughter on the phone this morning Disposition Plan: [facility] to [facility] today  Antibiotics:  Keflex  Time spent: 32 minutes.  Case discussed with nursing staff  Alford Highland  [employer]

## 2018-12-22 NOTE — Plan of Care (Signed)
  Problem: Education: Goal: Knowledge of General Education information will improve Description Including pain rating scale, medication(s)/side effects and non-pharmacologic comfort measures Outcome: Progressing   Problem: Health Behavior/Discharge Planning: Goal: Ability to manage health-related needs will improve Outcome: Progressing   Problem: Clinical Measurements: Goal: Ability to maintain clinical measurements within normal limits will improve Outcome: Progressing Goal: Will remain free from infection Outcome: Progressing Goal: Diagnostic test results will improve Outcome: Progressing Goal: Respiratory complications will improve Outcome: Progressing Goal: Cardiovascular complication will be avoided Outcome: Progressing   Problem: Nutrition: Goal: Adequate nutrition will be maintained Outcome: Progressing   Problem: Coping: Goal: Level of anxiety will decrease Outcome: Progressing   Problem: Elimination: Goal: Will not experience complications related to bowel motility Outcome: Progressing Goal: Will not experience complications related to urinary retention Outcome: Progressing   Problem: Safety: Goal: Ability to remain free from injury will improve Outcome: Progressing   Problem: Skin Integrity: Goal: Risk for impaired skin integrity will decrease Outcome: Progressing   Problem: Clinical Measurements: Goal: Signs and symptoms of infection will decrease Outcome: Progressing   Problem: Respiratory: Goal: Ability to maintain adequate ventilation will improve Outcome: Progressing

## 2018-12-22 NOTE — Plan of Care (Signed)
Patient able to maintain on room air without distress.

## 2018-12-22 NOTE — Clinical Social Work Note (Signed)
Patient to be d/c'ed today to [facility] room 302A.  Patient and family agreeable to plans will transport via ems RN to call report [phone].  CSW notified patient's daughter Raquel [phone]  Windell Moulding, MSW, LCSW [phone]

## 2019-01-05 ENCOUNTER — Ambulatory Visit
Admission: RE | Admit: 2019-01-05 | Discharge: 2019-01-05 | Disposition: A | Discharge status: Home / Self Care | Payer: Medicare HMO | Source: Ambulatory Visit | Attending: Acute Care | Admitting: Acute Care

## 2019-01-05 ENCOUNTER — Other Ambulatory Visit: Payer: Self-pay

## 2019-01-05 DIAGNOSIS — G35 Multiple sclerosis: Secondary | ICD-10-CM | POA: Insufficient documentation

## 2019-01-05 MED ORDER — GADOBUTROL 1 MMOL/ML IV SOLN
7.5000 mL | Freq: Once | INTRAVENOUS | Status: AC | PRN
  Administered 2019-01-05: 5 mL via INTRAVENOUS

## 2019-01-08 ENCOUNTER — Other Ambulatory Visit: Payer: Self-pay | Admitting: Family Medicine

## 2019-01-08 DIAGNOSIS — R1312 Dysphagia, oropharyngeal phase: Secondary | ICD-10-CM

## 2019-02-10 ENCOUNTER — Ambulatory Visit: Payer: Medicare HMO

## 2019-03-10 ENCOUNTER — Ambulatory Visit: Payer: Medicare HMO

## 2019-04-09 ENCOUNTER — Ambulatory Visit: Payer: Medicare HMO | Attending: Family Medicine

## 2019-04-09 ENCOUNTER — Ambulatory Visit: Payer: Medicare HMO

## 2019-05-07 DIAGNOSIS — Z1159 Encounter for screening for other viral diseases: Secondary | ICD-10-CM | POA: Diagnosis not present

## 2019-07-13 DIAGNOSIS — R6889 Other general symptoms and signs: Secondary | ICD-10-CM | POA: Diagnosis not present

## 2019-07-23 DIAGNOSIS — R6889 Other general symptoms and signs: Secondary | ICD-10-CM | POA: Diagnosis not present

## 2019-07-28 DIAGNOSIS — R6889 Other general symptoms and signs: Secondary | ICD-10-CM | POA: Diagnosis not present

## 2019-08-04 DIAGNOSIS — R6889 Other general symptoms and signs: Secondary | ICD-10-CM | POA: Diagnosis not present

## 2019-08-12 DIAGNOSIS — R6889 Other general symptoms and signs: Secondary | ICD-10-CM | POA: Diagnosis not present

## 2019-09-22 DIAGNOSIS — G35 Multiple sclerosis: Secondary | ICD-10-CM | POA: Diagnosis not present

## 2019-09-22 DIAGNOSIS — E785 Hyperlipidemia, unspecified: Secondary | ICD-10-CM | POA: Diagnosis not present

## 2019-09-22 DIAGNOSIS — G8314 Monoplegia of lower limb affecting left nondominant side: Secondary | ICD-10-CM | POA: Diagnosis not present

## 2019-09-22 DIAGNOSIS — Z9049 Acquired absence of other specified parts of digestive tract: Secondary | ICD-10-CM | POA: Diagnosis not present

## 2019-09-22 DIAGNOSIS — Z6824 Body mass index (BMI) 24.0-24.9, adult: Secondary | ICD-10-CM | POA: Diagnosis not present

## 2019-09-22 DIAGNOSIS — G4700 Insomnia, unspecified: Secondary | ICD-10-CM | POA: Diagnosis not present

## 2019-09-22 DIAGNOSIS — E119 Type 2 diabetes mellitus without complications: Secondary | ICD-10-CM | POA: Diagnosis not present

## 2019-09-22 DIAGNOSIS — K5900 Constipation, unspecified: Secondary | ICD-10-CM | POA: Diagnosis not present

## 2019-09-22 DIAGNOSIS — R413 Other amnesia: Secondary | ICD-10-CM | POA: Diagnosis not present

## 2019-11-09 DIAGNOSIS — E119 Type 2 diabetes mellitus without complications: Secondary | ICD-10-CM | POA: Diagnosis not present

## 2019-11-09 DIAGNOSIS — Z0000 Encounter for general adult medical examination without abnormal findings: Secondary | ICD-10-CM | POA: Diagnosis not present

## 2019-11-09 DIAGNOSIS — Z1159 Encounter for screening for other viral diseases: Secondary | ICD-10-CM | POA: Diagnosis not present

## 2019-11-09 DIAGNOSIS — R6889 Other general symptoms and signs: Secondary | ICD-10-CM | POA: Diagnosis not present

## 2019-11-09 DIAGNOSIS — R7309 Other abnormal glucose: Secondary | ICD-10-CM | POA: Diagnosis not present

## 2019-11-11 ENCOUNTER — Other Ambulatory Visit: Payer: Self-pay | Admitting: Family Medicine

## 2019-11-11 DIAGNOSIS — Z1231 Encounter for screening mammogram for malignant neoplasm of breast: Secondary | ICD-10-CM

## 2019-11-13 DIAGNOSIS — E785 Hyperlipidemia, unspecified: Secondary | ICD-10-CM | POA: Diagnosis not present

## 2019-11-13 DIAGNOSIS — E119 Type 2 diabetes mellitus without complications: Secondary | ICD-10-CM | POA: Diagnosis not present

## 2019-12-22 DIAGNOSIS — E119 Type 2 diabetes mellitus without complications: Secondary | ICD-10-CM | POA: Diagnosis not present

## 2019-12-22 DIAGNOSIS — E785 Hyperlipidemia, unspecified: Secondary | ICD-10-CM | POA: Diagnosis not present

## 2020-01-14 DIAGNOSIS — E119 Type 2 diabetes mellitus without complications: Secondary | ICD-10-CM | POA: Diagnosis not present

## 2020-01-14 DIAGNOSIS — G35 Multiple sclerosis: Secondary | ICD-10-CM | POA: Diagnosis not present

## 2020-02-18 DIAGNOSIS — G35 Multiple sclerosis: Secondary | ICD-10-CM | POA: Diagnosis not present

## 2020-02-18 DIAGNOSIS — E119 Type 2 diabetes mellitus without complications: Secondary | ICD-10-CM | POA: Diagnosis not present

## 2020-09-15 ENCOUNTER — Other Ambulatory Visit: Payer: Self-pay

## 2020-09-15 ENCOUNTER — Emergency Department
Admission: EM | Admit: 2020-09-15 | Discharge: 2020-09-15 | Disposition: A | Discharge status: Home / Self Care | Payer: Medicare HMO | Attending: Emergency Medicine | Admitting: Emergency Medicine

## 2020-09-15 DIAGNOSIS — E119 Type 2 diabetes mellitus without complications: Secondary | ICD-10-CM | POA: Diagnosis not present

## 2020-09-15 DIAGNOSIS — N898 Other specified noninflammatory disorders of vagina: Secondary | ICD-10-CM | POA: Insufficient documentation

## 2020-09-15 DIAGNOSIS — Z993 Dependence on wheelchair: Secondary | ICD-10-CM | POA: Diagnosis not present

## 2020-09-15 DIAGNOSIS — G35 Multiple sclerosis: Secondary | ICD-10-CM | POA: Diagnosis not present

## 2020-09-15 DIAGNOSIS — Z7984 Long term (current) use of oral hypoglycemic drugs: Secondary | ICD-10-CM | POA: Diagnosis not present

## 2020-09-15 DIAGNOSIS — B3731 Acute candidiasis of vulva and vagina: Secondary | ICD-10-CM

## 2020-09-15 LAB — WET PREP, GENITAL
Clue Cells Wet Prep HPF POC: NONE SEEN
Sperm: NONE SEEN
Trich, Wet Prep: NONE SEEN

## 2020-09-15 LAB — URINALYSIS, COMPLETE (UACMP) WITH MICROSCOPIC
Bilirubin Urine: NEGATIVE
Glucose, UA: >=500 mg/dL — AB
Ketones, ur: NEGATIVE mg/dL
Nitrite: NEGATIVE
Protein, ur: NEGATIVE mg/dL
Specific Gravity, Urine: 1.01 (ref 1.005–1.030)
WBC, UA: >50 WBC/hpf — ABNORMAL HIGH (ref 0–5)
pH: 5 (ref 5.0–8.0)

## 2020-09-15 LAB — CHLAMYDIA/NGC RT PCR (ARMC ONLY)
Chlamydia Tr: NOT DETECTED
N gonorrhoeae: NOT DETECTED

## 2020-09-15 MED ORDER — FLUCONAZOLE 100 MG PO TABS: 150.0000 mg | ORAL_TABLET | ORAL | 0 refills | Status: AC

## 2020-09-15 MED ORDER — CLOTRIMAZOLE-BETAMETHASONE 1-0.05 % EX CREA
1.0000 | TOPICAL_CREAM | Freq: Two times a day (BID) | CUTANEOUS | 0 refills | Status: DC
Start: 2020-09-15 — End: 2020-11-29

## 2020-09-15 MED ORDER — CLOTRIMAZOLE 2 % VA CREA
1.0000 | TOPICAL_CREAM | Freq: Once | VAGINAL | Status: DC
  Filled 2020-09-15: qty 21

## 2020-09-15 MED ORDER — CLOTRIMAZOLE-BETAMETHASONE 1-0.05 % EX CREA: 1.0000 "application " | TOPICAL_CREAM | Freq: Two times a day (BID) | CUTANEOUS | 0 refills | Status: DC

## 2020-09-15 MED ORDER — CLOTRIMAZOLE 1 % VA CREA
1.0000 | TOPICAL_CREAM | Freq: Every day | VAGINAL | Status: DC
  Administered 2020-09-15: 1 via VAGINAL
  Filled 2020-09-15: qty 45

## 2020-09-15 MED ORDER — FLUCONAZOLE 50 MG PO TABS
150.0000 mg | ORAL_TABLET | Freq: Once | ORAL | Status: AC
  Administered 2020-09-15: 150 mg via ORAL
  Filled 2020-09-15: qty 1

## 2020-09-15 MED ORDER — FOSFOMYCIN TROMETHAMINE 3 G PO PACK
3.0000 g | PACK | Freq: Once | ORAL | Status: AC
  Administered 2020-09-15: 3 g via ORAL
  Filled 2020-09-15: qty 3

## 2020-09-15 MED ORDER — FLUCONAZOLE 100 MG PO TABS: 150.0000 mg | ORAL_TABLET | ORAL | 0 refills | Status: DC

## 2020-09-15 NOTE — Progress Notes (Signed)
PT Cancellation Note  Patient Details Name: Desiree Espinoza MRN: [id number] DOB: 1956-08-27   Cancelled Treatment:    Reason Eval/Treat Not Completed: PT screened, no needs identified, will sign off.  Per chart review/discussion with OT patient is currently at baseline level of function with no skilled rehab needs at this time.  Will complete PT orders at this time but will reassess pt pending a change in status upon receipt of new PT orders.    Ovidio Hanger PT, DPT 09/15/20, 3:30 PM

## 2020-09-15 NOTE — Progress Notes (Signed)
PT Cancellation Note  Patient Details Name: Desiree Espinoza MRN: [id number] DOB: [birth year]   Cancelled Treatment:    Reason Eval/Treat Not Completed: Other (comment): ED social worker requested PT to discuss screen results with patient.  Pt reinforced that she discharged to [facility] for rehab at the end of 2019 but did not benefit from their services.  Pt reported that she returned home from SNF continuing to require dependent level of care from family.  Pt confirmed that she is bed bound and has not had the functional strength to transfer or ambulate in "around two years" and that spouse is required to lift the patient during transfers with pt unable to provide any assistance.  Asked the patient if she had a lift at home but she stated that she did not and that "my husband is my lift".  Pt/spouse would likely benefit from a lift at home for decreased caregiver assistance and risk of caregiver injury.  Reinforced with patient that PT unable to recommend skilled PT services at this time secondary to patient being at her long term functional baseline.  Pt understood and agreed, PT orders completed.   Ovidio Hanger PT, DPT 09/15/20, 4:25 PM

## 2020-09-15 NOTE — TOC Initial Note (Addendum)
Transition of Care [facility]) - Initial/Assessment Note    Patient Details  Name: Desiree Espinoza MRN: [id number] Date of Birth: Feb 04, 1956  Transition of [facility]) CM/SW Contact:    Marina Goodell Phone Number: [phone] 09/15/2020, 2:33 PM  Clinical Narrative:                  CSw spoke with patient and daughter Wilkie Aye [phone] (other contact [name] [phone] daughter), and explained role of TOC in patient care.  The patient is unable to perform ADLs without extensive assistance.  Patient's Savary,Jose (Spouse) [phone] is the primary care giver, and daughters assist him.  Patient uses [employer] transportation for [employer] and her medications is delivered.  Mr. Castner, takes care of all household necessities and he also cares for all of patient's personal needs. Ms. Sherrell Puller stated they would like for the patient to go to [facility] and if [facility] declined then a facility in [place] to be closer to her daughters.  CSW stated I had spoken with EDP and he requested a PT consult to assess for placement.  CSW explained the SNF placement and home health process and estimated timeline.  Both the patient and Ms. Sherrell Puller verbalized understanding.    CSW spoke with patient and Ms. Sherrell Puller about different options with home health and private care and encouraged them to contact insurance company and PCP office for recommendations on home care agencies. Patient and Ms. Sherrell Puller verbalized understanding.      Expected Discharge Plan: Skilled Nursing Facility Barriers to Discharge: ED SNF auth, Continued Medical Work up   Patient Goals and CMS Choice Patient states their goals for this hospitalization and ongoing recovery are:: Patient stated she would like to go to [facility]. CMS Medicare.gov Compare Post Acute Care list provided to:: Other (Comment Required) Choice offered to / list presented to : Adult Children Wilkie Aye [phone]  daughter)  Expected Discharge Plan and Services Expected Discharge Plan: Skilled Nursing Facility In-house Referral: Clinical Social Work   Post Acute Care Choice: Skilled Nursing Facility ([facility]) Living arrangements for the past 2 months: Single Family Home                                      Prior Living Arrangements/Services Living arrangements for the past 2 months: Single Family Home Lives with:: Spouse Patient language and need for interpreter reviewed:: Yes Do you feel safe going back to the place where you live?: Yes      Need for Family Participation in Patient Care: Yes (Comment) Care giver support system in place?: Yes (comment)   Criminal Activity/Legal Involvement Pertinent to Current Situation/Hospitalization: No - Comment as needed  Activities of Daily Living      Permission Sought/Granted Permission sought to share information with : Family Supports Wilkie Aye [phone] daughter)    Share Information with NAME: Wilkie Aye [phone] and Margarita Sermons [phone] daughters           Emotional Assessment Appearance:: Appears older than stated age Attitude/Demeanor/Rapport: Engaged Affect (typically observed): Stable Orientation: : Oriented to Self, Oriented to Place, Oriented to  Time, Oriented to Situation Alcohol / Substance Use: Not Applicable Psych Involvement: No (comment)  Admission diagnosis:  Weakness Patient Active Problem List   Diagnosis Date Noted  . Influenza 12/17/2018  . UTI (urinary tract infection) 10/28/2018  . Choledocholithiasis  with chronic cholecystitis 01/11/2018   PCP:  Preston Fleeting, MD Pharmacy:   [facility] [address]), [place] - [address] [place]) [place] [address] Phone: [phone] Fax: [phone]     Social Determinants of Health (SDOH) Interventions    Readmission Risk Interventions No flowsheet data  found.

## 2020-09-15 NOTE — Evaluation (Signed)
Occupational Therapy Evaluation Patient Details Name: Desiree Espinoza MRN: [id number] DOB: 09/29/1956 Today's Date: 09/15/2020    History of Present Illness 64 y.o. female who is wheelchair-bound secondary to MS who presents for 2 weeks of worsening vaginal discharge with surrounding redness in the vulvar region.  Patient states that she wears diapers consistently and has been using barrier cream however is having worsening white discharge.  Patient denies any dysuria, groin pain, abdominal pain, itching, or change in odor to this discharge.  Patient denies having similar symptoms in the past.   Clinical Impression   OT evaluation initiated and completed. Pt reports all self care performed from bed level with husband providing maximal assistance. Pt reports she is either in bed or in wheelchair during the day. Pt reports husband picks her up and puts her in chair. RN reports daughter verbalized "heavy assist" for transfer into wheelchair. Pt does not transfer onto commode chair and reports she uses brief during the day and is changed by family. She has not ambulated "in a very long time". Pt reports overall level of self care and functional mobility being "normal" (her baseline). Pt transferred to edge of ED stretcher and able to hold self up for several minutes with S - min A before fatiguing. Pt not standing/transferred from this position secondary to safety and pt's feet not touching floor. Pt verbalized and agreed several times that she is at her normal level of care other than needing more assistance for hygiene secondary to urinary incontinence secondary to UTI. OT will SIGN OFF as pt has no skilled need at this time.     Follow Up Recommendations  No OT follow up    Equipment Recommendations  None recommended by OT    Recommendations for Other Services Other (comment) (none at this time)     Precautions / Restrictions Precautions Precautions: Fall      Mobility Bed Mobility Overal  bed mobility: Needs Assistance Bed Mobility: Supine to Sit;Sit to Supine     Supine to sit: Max assist Sit to supine: Max assist   General bed mobility comments: max A for B LEs and trunk support to EOB    Transfers        General transfer comment: deferred secondary to pt sitting on edge of high ED stretcher    Balance Overall balance assessment: Needs assistance Sitting-balance support: Bilateral upper extremity supported;Feet unsupported Sitting balance-Leahy Scale: Fair Sitting balance - Comments: Pt able to hold self up on EOB with S - min A for several minutes. Pt reports she was able to sit EOB at this level at baseline       Standing balance comment: deferred for safety        ADL either performed or assessed with clinical judgement   ADL Overall ADL's : At baseline        General ADL Comments: Pt reports this level of care as being "normal" for her. Pt reports bed level self care tasks, use of brief for toileting needs, and transfer into wheelchair with heavy assistance.     Vision Patient Visual Report: No change from baseline              Pertinent Vitals/Pain Pain Assessment: No/denies pain     Hand Dominance Right   Extremity/Trunk Assessment Upper Extremity Assessment Upper Extremity Assessment: Overall WFL for tasks assessed;Generalized weakness   Lower Extremity Assessment Lower Extremity Assessment: Overall WFL for tasks assessed;Generalized weakness       Communication  Communication Communication: No difficulties   Cognition Arousal/Alertness: Awake/alert Behavior During Therapy: WFL for tasks assessed/performed Overall Cognitive Status: Within Functional Limits for tasks assessed        General Comments: Pt  knew month, year, location, and situation. Pt very cooperative and pleasant.              Home Living Family/patient expects to be discharged to:: Private residence Living Arrangements: Spouse/significant  other Available Help at Discharge: Family;Available 24 hours/day Type of Home: House       Home Layout: One level      Home Equipment: Wheelchair - manual          Prior Functioning/Environment Level of Independence: Needs assistance  Gait / Transfers Assistance Needed: Pt verbalized she transfers into wheelchair each day with "lots of help" from husband. She does not ambulate "in a very long time". ADL's / Homemaking Assistance Needed: Pt reports self care being before from bed level. Pt reports she uses a brief at home and husband changes her. Husband perfroms all IADL tasks         AM-PAC OT "6 Clicks" Daily Activity     Outcome Measure Help from another person eating meals?: A Little Help from another person taking care of personal grooming?: A Little Help from another person toileting, which includes using toliet, bedpan, or urinal?: Total Help from another person bathing (including washing, rinsing, drying)?: A Lot Help from another person to put on and taking off regular upper body clothing?: A Lot Help from another person to put on and taking off regular lower body clothing?: Total 6 Click Score: 12   End of Session Nurse Communication: Mobility status;Other (comment) (pt appears at baseline level)  Activity Tolerance: Patient tolerated treatment well Patient left: in bed;with call bell/phone within reach                   Time: 1356-1415 OT Time Calculation (min): 19 min Charges:  OT General Charges $OT Visit: 1 Visit OT Evaluation $OT Eval Moderate Complexity: 1 Mod OT Treatments $Self Care/Home Management : 8-22 mins  Jackquline Denmark, MS, OTR/L , CBIS ascom [phone]  09/15/20, 3:23 PM

## 2020-09-15 NOTE — ED Triage Notes (Signed)
Brought in by [facility] for 2 weeks of vaginal discharge, per EMS husband sts same is white in color.  Pt has hx of EMS and has been unable to see herself.  Denies burning with urination, no abd. Pain, no groin pain, no itching, no smell.

## 2020-09-15 NOTE — ED Notes (Signed)
IV catheter removed intact without complication.  D/C instructions given, placed in packet for pt, with vaginal cream for yeast infection.  Pt transported home via EMS.  Report given to EMS along with RX and d/c instructions.  All questions addressed.  Understanding verbalized by all parties.

## 2020-09-15 NOTE — ED Provider Notes (Signed)
-----------------------------------------   4:32 PM on 09/15/2020 -----------------------------------------  Patient has been seen by social work they believe the patient can go home with home health.  I have placed a face-to-face form for the patient.  Does appear to have a yeast infection, received Diflucan and clotrimazole.  Possible urinary tract infection as well.  We will treat with antibiotics and an additional dose of Diflucan in 7 days.  Patient will be discharged home.   Minna Antis, MD 09/15/20 1635

## 2020-09-15 NOTE — ED Notes (Signed)
Daughter asking about diet order, cleared with provider.  Ok to eat.  Daughter states she will go out and get something and bring it back to her.

## 2020-09-15 NOTE — ED Provider Notes (Addendum)
[facility] Emergency Department Provider Note   ____________________________________________   First MD Initiated Contact with Patient 09/15/20 0654     (approximate)  I have reviewed the triage vital signs and the nursing notes.   HISTORY  Chief Complaint Vaginal Discharge    HPI Desiree Espinoza is a 64 y.o. female who is wheelchair-bound secondary to MS who presents for 2 weeks of worsening vaginal discharge with surrounding redness in the vulvar region.  Patient states that she wears diapers consistently and has been using barrier cream however is having worsening white discharge.  Patient denies any dysuria, groin pain, abdominal pain, itching, or change in odor to this discharge.  Patient denies having similar symptoms in the past.         Past Medical History:  Diagnosis Date  . Choledocholithiasis 01/11/2018  . Diabetes mellitus without complication (HCC)   . Hyperlipemia   . MS (multiple sclerosis) (HCC)   . Neuromuscular disorder (HCC)   . Thyroid disease     Patient Active Problem List   Diagnosis Date Noted  . Influenza 12/17/2018  . UTI (urinary tract infection) 10/28/2018  . 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 CHOLECYSTECTOMY N/A 02/05/2018   Procedure: LAPAROSCOPIC CHOLECYSTECTOMY;  Surgeon: Henrene Dodge, MD;  Location: [facility] ORS;  Service: General;  Laterality: N/A;  . ENDOSCOPIC RETROGRADE CHOLANGIOPANCREATOGRAPHY (ERCP) WITH PROPOFOL N/A 01/12/2018   Procedure: ENDOSCOPIC RETROGRADE CHOLANGIOPANCREATOGRAPHY (ERCP) WITH PROPOFOL;  Surgeon: Midge Minium, MD;  Location: [facility] ENDOSCOPY;  Service: Endoscopy;  Laterality: N/A;    Prior to Admission medications   Medication Sig Start Date End Date Taking? Authorizing Provider  acetaminophen (TYLENOL) 325 MG tablet Take 2 tablets (650 mg total) by mouth every 6 (six) hours as needed for mild pain (or Fever  >/= 101). 10/30/18   Gouru, Deanna Artis, MD  atorvastatin (LIPITOR) 40 MG tablet Take 40 mg by mouth at bedtime.  12/19/17   [provider]  bisacodyl (DULCOLAX) 5 MG EC tablet Take 5 mg by mouth daily as needed for moderate constipation.    [provider]  diclofenac sodium (VOLTAREN) 1 % GEL Apply 2 g topically 4 (four) times daily as needed for pain.    [provider]  docusate sodium (COLACE) 100 MG capsule Take 100 mg by mouth 2 (two) times daily.    [provider]  ipratropium-albuterol (DUONEB) 0.5-2.5 (3) MG/3ML SOLN Take 3 mLs by nebulization 2 (two) times daily. 12/21/18   Alford Highland, MD  metFORMIN (GLUCOPHAGE) 1000 MG tablet Take 1,000 mg by mouth 2 (two) times daily with a meal.    [provider]  Multiple Vitamin (MULTI-VITAMIN DAILY) TABS Take 1 tablet by mouth daily.    [provider]  ondansetron (ZOFRAN) 4 MG tablet Take 1 tablet (4 mg total) by mouth every 6 (six) hours as needed for nausea. 10/30/18   Ramonita Lab, MD  Polyethylene Glycol POWD Take 17 g by mouth daily. 02/19/18   Lattie Haw, MD    Allergies Patient has no known allergies.  Family History  Problem Relation Age of Onset  . Diabetes Mother   . Throat cancer Father   . Breast cancer Sister   . Breast cancer Sister     Social History Social History   Tobacco Use  . Smoking status: Never Smoker  . Smokeless tobacco: Never Used  Vaping Use  . Vaping Use: Never used  Substance  Use Topics  . Alcohol use: No  . Drug use: No    Review of Systems Constitutional: No fever/chills Eyes: No visual changes. ENT: No sore throat. Cardiovascular: Denies chest pain. Respiratory: Denies shortness of breath. Gastrointestinal: No abdominal pain.  No nausea, no vomiting.  No diarrhea. Genitourinary: Negative for dysuria.  Positive for abnormal vaginal discharge Musculoskeletal: Negative for acute arthralgias Skin: Negative for rash. Neurological:  Negative for headaches, weakness/numbness/paresthesias in any extremity Psychiatric: Negative for suicidal ideation/homicidal ideation   ____________________________________________   PHYSICAL EXAM:  VITAL SIGNS: ED Triage Vitals  Enc Vitals Group     BP 09/15/20 0700 118/65     Pulse Rate 09/15/20 0700 (!) 123     Resp 09/15/20 0700 20     Temp 09/15/20 0700 98.6 F (37 C)     Temp Source 09/15/20 0700 Oral     SpO2 09/15/20 0700 96 %     Weight 09/15/20 0701 140 lb (63.5 kg)     Height 09/15/20 0701 5\' 5"  (1.651 m)     Head Circumference --      [facility] Flow --      Pain Score 09/15/20 0701 0     Pain Loc --      Pain Edu? --      Excl. in GC? --    Constitutional: Alert and oriented. Well appearing and in no acute distress. Eyes: Conjunctivae are normal. PERRL. Head: Atraumatic. Nose: No congestion/rhinnorhea. Mouth/Throat: Mucous membranes are moist. Neck: No stridor Cardiovascular: Grossly normal heart sounds.  Good peripheral circulation. Respiratory: Normal respiratory effort.  No retractions. Gastrointestinal: Soft and nontender. No distention. GU: Beefy red skin around entire pelvic region with a significant amount of white odorless discharge.  External exam extremely painful and internal exam not performed due to patient comfort Musculoskeletal: No obvious deformities Neurologic:  Normal speech and language. Skin:  Skin is warm and dry. No rash noted. Psychiatric: Mood and affect are normal. Speech and behavior are normal.  ____________________________________________   LABS (all labs ordered are listed, but only abnormal results are displayed)  Labs Reviewed  CHLAMYDIA/NGC RT PCR ([facility] ONLY)  WET PREP, GENITAL  URINALYSIS, COMPLETE (UACMP) WITH MICROSCOPIC   ____________________________________________  EKG  ED ECG REPORT I, 13/12/21, the attending physician, personally viewed and interpreted this ECG.  Date: 09/15/2020 EKG Time:  0652 Rate: 122 Rhythm: Tachycardic sinus rhythm QRS Axis: normal Intervals: normal ST/T Wave abnormalities: normal Narrative Interpretation: no evidence of acute ischemia   PROCEDURES  Procedure(s) performed (including Critical Care):  .1-3 Lead EKG Interpretation Performed by: [id number], MD Authorized by: Merwyn Katos, MD     Interpretation: normal     ECG rate:  95   ECG rate assessment: normal     Rhythm: sinus rhythm     Ectopy: none     Conduction: normal       ____________________________________________   INITIAL IMPRESSION / ASSESSMENT AND PLAN / ED COURSE  As part of my medical decision making, I reviewed the following data within the electronic MEDICAL RECORD NUMBER Nursing notes reviewed and incorporated, Labs reviewed, Old chart reviewed, and Notes from prior ED visits reviewed and incorporated        Patient is a 64 year old female wheelchair-bound from MS with complaints of worsening vaginal discharge.  Physical exam shows excoriated, beefy red tissue with gross vaginal discharge without foul odor.  Wet prep reveals yeast.  Differential diagnosis includes vaginal candidiasis, urinary tract infection, chlamydia/gonorrhea  infection, PID  No red flag symptomatology for the above differential diagnosis other than uncomplicated vaginal candidiasis  Rx: Fluconazole oral, clotrimazole topical  F/u: OB/GYN within the next 3 days  Dispo: Discharge home      ____________________________________________   FINAL CLINICAL IMPRESSION(S) / ED DIAGNOSES  Final diagnoses:  None     ED Discharge Orders    None       Note:  This document was prepared using Dragon voice recognition software and may include unintentional dictation errors.   Merwyn Katos, MD 09/15/20 1037    Merwyn Katos, MD 09/15/20 1037

## 2020-09-15 NOTE — TOC Transition Note (Signed)
Transition of Care [facility]) - CM/SW Discharge Note   Patient Details  Name: Desiree Espinoza MRN: [id number] Date of Birth: 04-30-1956  [facility]) CM/SW Contact:  Marina Goodell Phone Number:  [phone] 09/15/2020, 5:19 PM   Clinical Narrative:     Patient will d/c home with [facility] home health PT/OT/Aide.  EMS will transport. Will need hard copies of prescriptions.  EDP/ED Staff notified.  Patient's daughter Wilkie Aye updated on d/c and given home health rep contact information.  TOC consult complete.   Final next level of care: Home w Home Health Services Barriers to Discharge: ED SNF auth, Continued Medical Work up   Patient Goals and CMS Choice Patient states their goals for this hospitalization and ongoing recovery are:: Patient stated she would like to go to [facility]. CMS Medicare.gov Compare Post Acute Care list provided to:: Other (Comment Required) Choice offered to / list presented to : Adult Children Wilkie Aye [phone] daughter)  Discharge Placement                       Discharge Plan and Services In-house Referral: Clinical Social Work   Post Acute Care Choice: Skilled Nursing Facility ([facility])                               Social Determinants of Health (SDOH) Interventions     Readmission Espinoza Interventions No flowsheet data found.

## 2020-09-15 NOTE — ED Notes (Signed)
Daughter expresses concern for taking mother home with an UTI and this severe yeast infection.  She states her father feels he is unable to care for her and she wishes to have social services consulted about placement to [facility], as they are very familiar with her there.  Provider notified.

## 2020-09-15 NOTE — ED Notes (Signed)
[facility]  CALLED  FOR  TRANSPORT  HOME 

## 2020-09-15 NOTE — ED Notes (Signed)
Linens changed, brief changed, pt wiped down thoroughly.   Tolerated well.

## 2020-09-15 NOTE — ED Notes (Signed)
Pt cleansed, linens changed,  purewick placed.  Pt tolerated well.

## 2020-11-28 ENCOUNTER — Encounter: Payer: Self-pay | Admitting: *Deleted

## 2020-11-28 ENCOUNTER — Inpatient Hospital Stay
Admission: EM | Admit: 2020-11-28 | Discharge: 2020-12-06 | DRG: 872 | Disposition: A | Discharge status: Home Health | Payer: Medicare HMO | Attending: Internal Medicine | Admitting: Internal Medicine

## 2020-11-28 ENCOUNTER — Other Ambulatory Visit: Payer: Self-pay

## 2020-11-28 DIAGNOSIS — B3749 Other urogenital candidiasis: Secondary | ICD-10-CM

## 2020-11-28 DIAGNOSIS — D75839 Thrombocytosis, unspecified: Secondary | ICD-10-CM | POA: Diagnosis not present

## 2020-11-28 DIAGNOSIS — Z79899 Other long term (current) drug therapy: Secondary | ICD-10-CM

## 2020-11-28 DIAGNOSIS — R21 Rash and other nonspecific skin eruption: Secondary | ICD-10-CM | POA: Diagnosis not present

## 2020-11-28 DIAGNOSIS — B377 Candidal sepsis: Secondary | ICD-10-CM | POA: Diagnosis not present

## 2020-11-28 DIAGNOSIS — Z808 Family history of malignant neoplasm of other organs or systems: Secondary | ICD-10-CM

## 2020-11-28 DIAGNOSIS — A499 Bacterial infection, unspecified: Secondary | ICD-10-CM

## 2020-11-28 DIAGNOSIS — Z20822 Contact with and (suspected) exposure to covid-19: Secondary | ICD-10-CM | POA: Diagnosis present

## 2020-11-28 DIAGNOSIS — E785 Hyperlipidemia, unspecified: Secondary | ICD-10-CM | POA: Diagnosis present

## 2020-11-28 DIAGNOSIS — R319 Hematuria, unspecified: Secondary | ICD-10-CM

## 2020-11-28 DIAGNOSIS — E872 Acidosis: Secondary | ICD-10-CM | POA: Diagnosis present

## 2020-11-28 DIAGNOSIS — Z7984 Long term (current) use of oral hypoglycemic drugs: Secondary | ICD-10-CM

## 2020-11-28 DIAGNOSIS — R630 Anorexia: Secondary | ICD-10-CM

## 2020-11-28 DIAGNOSIS — N390 Urinary tract infection, site not specified: Secondary | ICD-10-CM | POA: Diagnosis present

## 2020-11-28 DIAGNOSIS — Z803 Family history of malignant neoplasm of breast: Secondary | ICD-10-CM

## 2020-11-28 DIAGNOSIS — B372 Candidiasis of skin and nail: Principal | ICD-10-CM | POA: Diagnosis present

## 2020-11-28 DIAGNOSIS — E876 Hypokalemia: Secondary | ICD-10-CM | POA: Diagnosis not present

## 2020-11-28 DIAGNOSIS — R110 Nausea: Secondary | ICD-10-CM

## 2020-11-28 DIAGNOSIS — I959 Hypotension, unspecified: Secondary | ICD-10-CM

## 2020-11-28 DIAGNOSIS — Z833 Family history of diabetes mellitus: Secondary | ICD-10-CM

## 2020-11-28 DIAGNOSIS — Z993 Dependence on wheelchair: Secondary | ICD-10-CM

## 2020-11-28 DIAGNOSIS — E119 Type 2 diabetes mellitus without complications: Secondary | ICD-10-CM | POA: Diagnosis present

## 2020-11-28 DIAGNOSIS — G35 Multiple sclerosis: Secondary | ICD-10-CM | POA: Diagnosis present

## 2020-11-28 LAB — COMPREHENSIVE METABOLIC PANEL
ALT: 20 U/L (ref 0–44)
AST: 20 U/L (ref 15–41)
Albumin: 3.7 g/dL (ref 3.5–5.0)
Alkaline Phosphatase: 94 U/L (ref 38–126)
Anion gap: 13 (ref 5–15)
BUN: 6 mg/dL — ABNORMAL LOW (ref 8–23)
CO2: 22 mmol/L (ref 22–32)
Calcium: 9.2 mg/dL (ref 8.9–10.3)
Chloride: 100 mmol/L (ref 98–111)
Creatinine, Ser: 0.43 mg/dL — ABNORMAL LOW (ref 0.44–1.00)
GFR, Estimated: >60 mL/min (ref >60)
Glucose, Bld: 293 mg/dL — ABNORMAL HIGH (ref 70–99)
Potassium: 4.1 mmol/L (ref 3.5–5.1)
Sodium: 135 mmol/L (ref 135–145)
Total Bilirubin: 0.8 mg/dL (ref 0.3–1.2)
Total Protein: 8.3 g/dL — ABNORMAL HIGH (ref 6.5–8.1)

## 2020-11-28 LAB — CBC WITH DIFFERENTIAL/PLATELET
Abs Immature Granulocytes: 0.03 10*3/uL (ref 0.00–0.07)
Basophils Absolute: 0.1 10*3/uL (ref 0.0–0.1)
Basophils Relative: 1 %
Eosinophils Absolute: 0.2 10*3/uL (ref 0.0–0.5)
Eosinophils Relative: 2 %
HCT: 46.2 % — ABNORMAL HIGH (ref 36.0–46.0)
Hemoglobin: 14.9 g/dL (ref 12.0–15.0)
Immature Granulocytes: 0 %
Lymphocytes Relative: 35 %
Lymphs Abs: 3 10*3/uL (ref 0.7–4.0)
MCH: 26.9 pg (ref 26.0–34.0)
MCHC: 32.3 g/dL (ref 30.0–36.0)
MCV: 83.4 fL (ref 80.0–100.0)
Monocytes Absolute: 0.7 10*3/uL (ref 0.1–1.0)
Monocytes Relative: 8 %
Neutro Abs: 4.7 10*3/uL (ref 1.7–7.7)
Neutrophils Relative %: 54 %
Platelets: 434 10*3/uL — ABNORMAL HIGH (ref 150–400)
RBC: 5.54 MIL/uL — ABNORMAL HIGH (ref 3.87–5.11)
RDW: 15.3 % (ref 11.5–15.5)
WBC: 8.6 10*3/uL (ref 4.0–10.5)
nRBC: 0 % (ref 0.0–0.2)

## 2020-11-28 MED ORDER — FLUCONAZOLE 100 MG PO TABS
200.0000 mg | ORAL_TABLET | Freq: Once | ORAL | Status: AC
  Administered 2020-11-28: 200 mg via ORAL
  Filled 2020-11-28: qty 2

## 2020-11-28 MED ORDER — METFORMIN HCL 500 MG PO TABS
1000.0000 mg | ORAL_TABLET | Freq: Two times a day (BID) | ORAL | Status: DC
  Administered 2020-11-29 (×2): 1000 mg via ORAL
  Filled 2020-11-28 (×2): qty 2

## 2020-11-28 MED ORDER — DOCUSATE SODIUM 100 MG PO CAPS
100.0000 mg | ORAL_CAPSULE | Freq: Two times a day (BID) | ORAL | Status: DC
  Administered 2020-11-28 – 2020-12-06 (×15): 100 mg via ORAL
  Filled 2020-11-28 (×15): qty 1

## 2020-11-28 MED ORDER — AMOXICILLIN 500 MG PO CAPS
500.0000 mg | ORAL_CAPSULE | Freq: Three times a day (TID) | ORAL | Status: DC
  Administered 2020-11-29 (×2): 500 mg via ORAL
  Filled 2020-11-28 (×2): qty 1

## 2020-11-28 MED ORDER — ATORVASTATIN CALCIUM 20 MG PO TABS
40.0000 mg | ORAL_TABLET | Freq: Every day | ORAL | Status: DC
  Administered 2020-11-28 – 2020-12-05 (×8): 40 mg via ORAL
  Filled 2020-11-28 (×8): qty 2

## 2020-11-28 MED ORDER — AMOXICILLIN 500 MG PO CAPS
500.0000 mg | ORAL_CAPSULE | Freq: Once | ORAL | Status: AC
  Administered 2020-11-28: 500 mg via ORAL
  Filled 2020-11-28: qty 1

## 2020-11-28 MED ORDER — TRAMADOL HCL 50 MG PO TABS
50.0000 mg | ORAL_TABLET | Freq: Once | ORAL | Status: AC
  Administered 2020-11-28: 50 mg via ORAL
  Filled 2020-11-28: qty 1

## 2020-11-28 NOTE — ED Notes (Signed)
Provider notified that vitals have been updated.

## 2020-11-28 NOTE — ED Triage Notes (Signed)
Pt brought in via ems from home. Pt has MS.  Pt has rash to vaginal area and buttocks.  Area red and excoriated.  Pt alert.  Pt is wheelchair bound.

## 2020-11-28 NOTE — ED Provider Notes (Addendum)
[facility] Emergency Department Provider Note ____________________________________________   Event Date/Time   First MD Initiated Contact with Patient 11/28/20 2147     (approximate)  I have reviewed the triage vital signs and the nursing notes.   HISTORY  Chief Complaint Rash  HPI Desiree Espinoza is a 65 y.o. female with history of multiple sclerosis, diabetes, neuromuscular disorder, and other history as listed below presents to the emergency department for treatment and evaluation of rash and yeast infection.  She is using cream that was prescribed without any relief.  Also, according to the daughter there is no one at home that can take care of patient any longer.  She lives with her husband who is also handicapped.  Her daughter lives in [place] and is only able to come check on them a couple of times a week.  Patient and daughter would like skilled nursing facility placement.         Past Medical History:  Diagnosis Date  . Choledocholithiasis 01/11/2018  . Diabetes mellitus without complication (HCC)   . Hyperlipemia   . MS (multiple sclerosis) (HCC)   . Neuromuscular disorder (HCC)   . Thyroid disease     Patient Active Problem List   Diagnosis Date Noted  . Influenza 12/17/2018  . UTI (urinary tract infection) 10/28/2018  . 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 CHOLECYSTECTOMY N/A 02/05/2018   Procedure: LAPAROSCOPIC CHOLECYSTECTOMY;  Surgeon: Henrene Dodge, MD;  Location: [facility] ORS;  Service: General;  Laterality: N/A;  . ENDOSCOPIC RETROGRADE CHOLANGIOPANCREATOGRAPHY (ERCP) WITH PROPOFOL N/A 01/12/2018   Procedure: ENDOSCOPIC RETROGRADE CHOLANGIOPANCREATOGRAPHY (ERCP) WITH PROPOFOL;  Surgeon: Midge Minium, MD;  Location: [facility] ENDOSCOPY;  Service: Endoscopy;  Laterality: N/A;    Prior to Admission medications   Medication Sig Start Date End Date Taking?  Authorizing Provider  acetaminophen (TYLENOL) 325 MG tablet Take 2 tablets (650 mg total) by mouth every 6 (six) hours as needed for mild pain (or Fever >/= 101). 10/30/18   Gouru, Deanna Artis, MD  atorvastatin (LIPITOR) 40 MG tablet Take 40 mg by mouth at bedtime.  12/19/17   [provider]  bisacodyl (DULCOLAX) 5 MG EC tablet Take 5 mg by mouth daily as needed for moderate constipation.    [provider]  clotrimazole-betamethasone (LOTRISONE) cream Apply 1 application topically 2 (two) times daily. 09/15/20   Minna Antis, MD  diclofenac sodium (VOLTAREN) 1 % GEL Apply 2 g topically 4 (four) times daily as needed for pain.    [provider]  docusate sodium (COLACE) 100 MG capsule Take 100 mg by mouth 2 (two) times daily.    [provider]  ipratropium-albuterol (DUONEB) 0.5-2.5 (3) MG/3ML SOLN Take 3 mLs by nebulization 2 (two) times daily. 12/21/18   Alford Highland, MD  metFORMIN (GLUCOPHAGE) 1000 MG tablet Take 1,000 mg by mouth 2 (two) times daily with a meal.    [provider]  Multiple Vitamin (MULTI-VITAMIN DAILY) TABS Take 1 tablet by mouth daily.    [provider]  ondansetron (ZOFRAN) 4 MG tablet Take 1 tablet (4 mg total) by mouth every 6 (six) hours as needed for nausea. 10/30/18   Ramonita Lab, MD  Polyethylene Glycol POWD Take 17 g by mouth daily. 02/19/18   Lattie Haw, MD    Allergies Patient has no known allergies.  Family History  Problem Relation Age of Onset  . Diabetes Mother   . Throat  cancer Father   . Breast cancer Sister   . Breast cancer Sister     Social History Social History   Tobacco Use  . Smoking status: Never Smoker  . Smokeless tobacco: Never Used  Vaping Use  . Vaping Use: Never used  Substance Use Topics  . Alcohol use: No  . Drug use: No    Review of Systems  Constitutional: No fever/chills Eyes: No visual changes. ENT: No sore throat. Cardiovascular: Denies chest  pain. Respiratory: Denies shortness of breath. Gastrointestinal: No abdominal pain.  No nausea, no vomiting.  No diarrhea.  No constipation. Genitourinary: Negative for dysuria. Musculoskeletal: Negative for back pain. Skin: Positive for rash already. Neurological: Negative for headaches, focal weakness or numbness  ____________________________________________   PHYSICAL EXAM:  VITAL SIGNS: ED Triage Vitals  Enc Vitals Group     BP 11/28/20 1955 (!) 153/90     Pulse Rate 11/28/20 1955 (!) 122     Resp 11/28/20 1955 20     Temp 11/28/20 1955 98.6 F (37 C)     Temp Source 11/28/20 1955 Oral     SpO2 11/28/20 1955 98 %     Weight 11/28/20 2002 140 lb (63.5 kg)     Height 11/28/20 2002 5\' 5"  (1.651 m)     Head Circumference --      Peak Flow --      Pain Score 11/28/20 2002 8     Pain Loc --      Pain Edu? --      Excl. in GC? --     Constitutional: Alert and oriented. Well appearing and in no acute distress. Eyes: Conjunctivae are normal. PERRL. EOMI. Head: Atraumatic. Nose: No congestion/rhinnorhea. Mouth/Throat: Mucous membranes are moist.  Oropharynx non-erythematous. Neck: No stridor.   Hematological/Lymphatic/Immunilogical: No cervical lymphadenopathy. Cardiovascular: Normal rate, regular rhythm. Grossly normal heart sounds.  Good peripheral circulation. Respiratory: Normal respiratory effort.  No retractions. Lungs CTAB. Gastrointestinal: Soft and nontender. No distention. No abdominal bruits. No CVA tenderness. Genitourinary:  Musculoskeletal: No lower extremity tenderness nor edema.  No joint effusions. Neurologic:  Normal speech and language. No gross focal neurologic deficits are appreciated. No gait instability. Skin: Skin overlying the labia with a widespread Candida infection.  She also has a macular, Erythematous. and excoriated areas overlying the buttocks. Psychiatric: Mood and affect are normal. Speech and behavior are  normal.  ____________________________________________   LABS (all labs ordered are listed, but only abnormal results are displayed)  Labs Reviewed  COMPREHENSIVE METABOLIC PANEL - Abnormal; Notable for the following components:      Result Value   Glucose, Bld 293 (*)    BUN 6 (*)    Creatinine, Ser 0.43 (*)    Total Protein 8.3 (*)    All other components within normal limits  CBC WITH DIFFERENTIAL/PLATELET - Abnormal; Notable for the following components:   RBC 5.54 (*)    HCT 46.2 (*)    Platelets 434 (*)    All other components within normal limits  SARS CORONAVIRUS 2 (TAT 6-24 HRS)  CBG MONITORING, ED   ____________________________________________  EKG  Not indicated ____________________________________________  RADIOLOGY  ED MD interpretation:    Not indicated  I, Dymond Spreen, personally viewed and evaluated these images (plain radiographs) as part of my medical decision making, as well as reviewing the written report by the radiologist.  Official radiology report(s): No results found.  ____________________________________________   PROCEDURES  Procedure(s) performed (including Critical Care):  Procedures  ____________________________________________   INITIAL IMPRESSION / ASSESSMENT AND PLAN     65 year old female presenting to the emergency department with her daughter for symptoms and concerns as described in the HPI.  ED COURSE  Patient will be treated with Diflucan and amoxicillin.  Patient does not meet admission criteria based on labs.  She will board here in the emergency department until social work can assess her and assist with possibly getting into a skilled nursing facility such as [facility].  Patient and daughter are aware and agree with the plan.    ___________________________________________   FINAL CLINICAL IMPRESSION(S) / ED DIAGNOSES  Final diagnoses:  Candidal skin infection     ED Discharge Orders    None        Aleecia Tapia was evaluated in Emergency Department on 11/28/2020 for the symptoms described in the history of present illness. She was evaluated in the context of the global COVID-19 pandemic, which necessitated consideration that the patient might be at risk for infection with the SARS-CoV-2 virus that causes COVID-19. Institutional protocols and algorithms that pertain to the evaluation of patients at risk for COVID-19 are in a state of rapid change based on information released by regulatory bodies including the CDC and federal and state organizations. These policies and algorithms were followed during the patient's care in the ED.   Note:  This document was prepared using Dragon voice recognition software and may include unintentional dictation errors.   Chinita Pester, FNP 11/28/20 2339    Chinita Pester, FNP 11/28/20 2344    Chesley Noon, MD 11/29/20 [phone]

## 2020-11-28 NOTE — ED Triage Notes (Signed)
EMS brings pt in from home for c/o ulcers on buttocks for "a long time"; unable to get an appt until 2pm tomorrow so called ambulance

## 2020-11-29 ENCOUNTER — Emergency Department: Payer: Medicare HMO

## 2020-11-29 DIAGNOSIS — Z993 Dependence on wheelchair: Secondary | ICD-10-CM | POA: Diagnosis not present

## 2020-11-29 DIAGNOSIS — N3000 Acute cystitis without hematuria: Secondary | ICD-10-CM | POA: Diagnosis not present

## 2020-11-29 DIAGNOSIS — E785 Hyperlipidemia, unspecified: Secondary | ICD-10-CM | POA: Diagnosis present

## 2020-11-29 DIAGNOSIS — E876 Hypokalemia: Secondary | ICD-10-CM | POA: Diagnosis not present

## 2020-11-29 DIAGNOSIS — Z20822 Contact with and (suspected) exposure to covid-19: Secondary | ICD-10-CM | POA: Diagnosis present

## 2020-11-29 DIAGNOSIS — E1169 Type 2 diabetes mellitus with other specified complication: Secondary | ICD-10-CM | POA: Diagnosis not present

## 2020-11-29 DIAGNOSIS — E119 Type 2 diabetes mellitus without complications: Secondary | ICD-10-CM | POA: Diagnosis present

## 2020-11-29 DIAGNOSIS — A419 Sepsis, unspecified organism: Secondary | ICD-10-CM | POA: Diagnosis not present

## 2020-11-29 DIAGNOSIS — R21 Rash and other nonspecific skin eruption: Secondary | ICD-10-CM | POA: Diagnosis present

## 2020-11-29 DIAGNOSIS — B377 Candidal sepsis: Secondary | ICD-10-CM | POA: Diagnosis present

## 2020-11-29 DIAGNOSIS — Z833 Family history of diabetes mellitus: Secondary | ICD-10-CM | POA: Diagnosis not present

## 2020-11-29 DIAGNOSIS — B372 Candidiasis of skin and nail: Secondary | ICD-10-CM | POA: Diagnosis present

## 2020-11-29 DIAGNOSIS — Z79899 Other long term (current) drug therapy: Secondary | ICD-10-CM | POA: Diagnosis not present

## 2020-11-29 DIAGNOSIS — G35 Multiple sclerosis: Secondary | ICD-10-CM | POA: Diagnosis present

## 2020-11-29 DIAGNOSIS — D75839 Thrombocytosis, unspecified: Secondary | ICD-10-CM | POA: Diagnosis not present

## 2020-11-29 DIAGNOSIS — Z7984 Long term (current) use of oral hypoglycemic drugs: Secondary | ICD-10-CM | POA: Diagnosis not present

## 2020-11-29 DIAGNOSIS — Z803 Family history of malignant neoplasm of breast: Secondary | ICD-10-CM | POA: Diagnosis not present

## 2020-11-29 DIAGNOSIS — E872 Acidosis: Secondary | ICD-10-CM | POA: Diagnosis present

## 2020-11-29 DIAGNOSIS — Z808 Family history of malignant neoplasm of other organs or systems: Secondary | ICD-10-CM | POA: Diagnosis not present

## 2020-11-29 DIAGNOSIS — B379 Candidiasis, unspecified: Secondary | ICD-10-CM | POA: Diagnosis not present

## 2020-11-29 DIAGNOSIS — N390 Urinary tract infection, site not specified: Secondary | ICD-10-CM | POA: Diagnosis present

## 2020-11-29 DIAGNOSIS — D72828 Other elevated white blood cell count: Secondary | ICD-10-CM | POA: Diagnosis not present

## 2020-11-29 LAB — LACTIC ACID, PLASMA
Lactic Acid, Venous: 2.8 mmol/L (ref 0.5–1.9)
Lactic Acid, Venous: 2.7 mmol/L (ref 0.5–1.9)
Lactic Acid, Venous: 2.6 mmol/L (ref 0.5–1.9)

## 2020-11-29 LAB — COMPREHENSIVE METABOLIC PANEL
ALT: 21 U/L (ref 0–44)
ALT: 17 U/L (ref 0–44)
AST: 20 U/L (ref 15–41)
AST: 21 U/L (ref 15–41)
Albumin: 3.5 g/dL (ref 3.5–5.0)
Albumin: 3.1 g/dL — ABNORMAL LOW (ref 3.5–5.0)
Alkaline Phosphatase: 93 U/L (ref 38–126)
Alkaline Phosphatase: 77 U/L (ref 38–126)
Anion gap: 13 (ref 5–15)
Anion gap: 10 (ref 5–15)
BUN: 8 mg/dL (ref 8–23)
BUN: <5 mg/dL — ABNORMAL LOW (ref 8–23)
CO2: 24 mmol/L (ref 22–32)
CO2: 25 mmol/L (ref 22–32)
Calcium: 8.8 mg/dL — ABNORMAL LOW (ref 8.9–10.3)
Calcium: 8.5 mg/dL — ABNORMAL LOW (ref 8.9–10.3)
Chloride: 97 mmol/L — ABNORMAL LOW (ref 98–111)
Chloride: 106 mmol/L (ref 98–111)
Creatinine, Ser: 0.37 mg/dL — ABNORMAL LOW (ref 0.44–1.00)
Creatinine, Ser: 0.4 mg/dL — ABNORMAL LOW (ref 0.44–1.00)
GFR, Estimated: >60 mL/min (ref >60)
GFR, Estimated: >60 mL/min (ref >60)
Glucose, Bld: 330 mg/dL — ABNORMAL HIGH (ref 70–99)
Glucose, Bld: 190 mg/dL — ABNORMAL HIGH (ref 70–99)
Potassium: 4.3 mmol/L (ref 3.5–5.1)
Potassium: 3.6 mmol/L (ref 3.5–5.1)
Sodium: 134 mmol/L — ABNORMAL LOW (ref 135–145)
Sodium: 141 mmol/L (ref 135–145)
Total Bilirubin: 1.1 mg/dL (ref 0.3–1.2)
Total Bilirubin: 0.8 mg/dL (ref 0.3–1.2)
Total Protein: 7.6 g/dL (ref 6.5–8.1)
Total Protein: 7.2 g/dL (ref 6.5–8.1)

## 2020-11-29 LAB — URINALYSIS, COMPLETE (UACMP) WITH MICROSCOPIC
Bilirubin Urine: NEGATIVE
Glucose, UA: >=500 mg/dL — AB
Ketones, ur: 20 mg/dL — AB
Nitrite: NEGATIVE
Protein, ur: 100 mg/dL — AB
RBC / HPF: >50 RBC/hpf — ABNORMAL HIGH (ref 0–5)
Specific Gravity, Urine: 1.026 (ref 1.005–1.030)
WBC, UA: >50 WBC/hpf — ABNORMAL HIGH (ref 0–5)
pH: 5 (ref 5.0–8.0)

## 2020-11-29 LAB — CBC WITH DIFFERENTIAL/PLATELET
Abs Immature Granulocytes: 0.05 10*3/uL (ref 0.00–0.07)
Basophils Absolute: 0 10*3/uL (ref 0.0–0.1)
Basophils Relative: 0 %
Eosinophils Absolute: 0 10*3/uL (ref 0.0–0.5)
Eosinophils Relative: 0 %
HCT: 42.2 % (ref 36.0–46.0)
Hemoglobin: 13.1 g/dL (ref 12.0–15.0)
Immature Granulocytes: 1 %
Lymphocytes Relative: 17 %
Lymphs Abs: 1.6 10*3/uL (ref 0.7–4.0)
MCH: 25.9 pg — ABNORMAL LOW (ref 26.0–34.0)
MCHC: 31 g/dL (ref 30.0–36.0)
MCV: 83.4 fL (ref 80.0–100.0)
Monocytes Absolute: 0.6 10*3/uL (ref 0.1–1.0)
Monocytes Relative: 7 %
Neutro Abs: 7.4 10*3/uL (ref 1.7–7.7)
Neutrophils Relative %: 75 %
Platelets: 377 10*3/uL (ref 150–400)
RBC: 5.06 MIL/uL (ref 3.87–5.11)
RDW: 15.5 % (ref 11.5–15.5)
WBC: 9.7 10*3/uL (ref 4.0–10.5)
nRBC: 0 % (ref 0.0–0.2)

## 2020-11-29 LAB — SARS CORONAVIRUS 2 (TAT 6-24 HRS): SARS Coronavirus 2: NEGATIVE

## 2020-11-29 LAB — CBG MONITORING, ED
Glucose-Capillary: 235 mg/dL — ABNORMAL HIGH (ref 70–99)
Glucose-Capillary: 170 mg/dL — ABNORMAL HIGH (ref 70–99)

## 2020-11-29 MED ORDER — ACETAMINOPHEN 650 MG RE SUPP: 650.0000 mg | Freq: Four times a day (QID) | RECTAL | Status: DC | PRN

## 2020-11-29 MED ORDER — SODIUM CHLORIDE 0.9 % IV SOLN
1.0000 g | INTRAVENOUS | Status: DC
  Administered 2020-11-29 – 2020-11-30 (×2): 1 g via INTRAVENOUS
  Filled 2020-11-29 (×2): qty 10
  Filled 2020-11-29: qty 1

## 2020-11-29 MED ORDER — ONDANSETRON HCL 4 MG PO TABS: 4.0000 mg | ORAL_TABLET | Freq: Four times a day (QID) | ORAL | Status: DC | PRN

## 2020-11-29 MED ORDER — ONDANSETRON HCL 4 MG/2ML IJ SOLN: 4.0000 mg | Freq: Four times a day (QID) | INTRAMUSCULAR | Status: DC | PRN

## 2020-11-29 MED ORDER — LACTATED RINGERS IV SOLN: INTRAVENOUS | Status: DC

## 2020-11-29 MED ORDER — NYSTATIN 100000 UNIT/GM EX OINT
TOPICAL_OINTMENT | Freq: Two times a day (BID) | CUTANEOUS | Status: DC
  Administered 2020-12-04: 1 via TOPICAL
  Filled 2020-11-29 (×2): qty 15

## 2020-11-29 MED ORDER — LACTATED RINGERS IV BOLUS
1000.0000 mL | Freq: Once | INTRAVENOUS | Status: AC
  Administered 2020-11-29: 1000 mL via INTRAVENOUS

## 2020-11-29 MED ORDER — ONDANSETRON HCL 4 MG/2ML IJ SOLN
4.0000 mg | Freq: Once | INTRAMUSCULAR | Status: AC
  Administered 2020-11-29: 4 mg via INTRAVENOUS
  Filled 2020-11-29: qty 2

## 2020-11-29 MED ORDER — LACTATED RINGERS IV BOLUS (SEPSIS)
1000.0000 mL | Freq: Once | INTRAVENOUS | Status: AC
  Administered 2020-11-29: 1000 mL via INTRAVENOUS

## 2020-11-29 MED ORDER — ENOXAPARIN SODIUM 40 MG/0.4ML ~~LOC~~ SOLN
40.0000 mg | SUBCUTANEOUS | Status: DC
  Administered 2020-11-29 – 2020-12-05 (×7): 40 mg via SUBCUTANEOUS
  Filled 2020-11-29 (×8): qty 0.4

## 2020-11-29 MED ORDER — SODIUM CHLORIDE 0.9 % IV BOLUS
1000.0000 mL | Freq: Once | INTRAVENOUS | Status: AC
  Administered 2020-11-29: 1000 mL via INTRAVENOUS

## 2020-11-29 MED ORDER — SODIUM CHLORIDE 0.9% FLUSH
3.0000 mL | Freq: Two times a day (BID) | INTRAVENOUS | Status: DC
  Administered 2020-11-29 – 2020-12-06 (×12): 3 mL via INTRAVENOUS

## 2020-11-29 MED ORDER — OXYCODONE HCL 5 MG PO TABS
5.0000 mg | ORAL_TABLET | ORAL | Status: DC | PRN
  Administered 2020-12-02 – 2020-12-06 (×7): 5 mg via ORAL
  Filled 2020-11-29 (×8): qty 1

## 2020-11-29 MED ORDER — BISACODYL 5 MG PO TBEC: 5.0000 mg | DELAYED_RELEASE_TABLET | Freq: Every day | ORAL | Status: DC | PRN

## 2020-11-29 MED ORDER — INSULIN ASPART 100 UNIT/ML ~~LOC~~ SOLN
0.0000 [IU] | Freq: Three times a day (TID) | SUBCUTANEOUS | Status: DC
  Administered 2020-11-29: 2 [IU] via SUBCUTANEOUS
  Administered 2020-11-30: 3 [IU] via SUBCUTANEOUS
  Administered 2020-11-30 (×2): 2 [IU] via SUBCUTANEOUS
  Administered 2020-12-01: 5 [IU] via SUBCUTANEOUS
  Administered 2020-12-01: 2 [IU] via SUBCUTANEOUS
  Administered 2020-12-02: 5 [IU] via SUBCUTANEOUS
  Administered 2020-12-02: 3 [IU] via SUBCUTANEOUS
  Administered 2020-12-02 – 2020-12-03 (×2): 5 [IU] via SUBCUTANEOUS
  Administered 2020-12-03 (×2): 3 [IU] via SUBCUTANEOUS
  Administered 2020-12-04: 5 [IU] via SUBCUTANEOUS
  Administered 2020-12-04: 3 [IU] via SUBCUTANEOUS
  Administered 2020-12-04 – 2020-12-06 (×5): 5 [IU] via SUBCUTANEOUS
  Administered 2020-12-06: 3 [IU] via SUBCUTANEOUS
  Filled 2020-11-29 (×19): qty 1

## 2020-11-29 MED ORDER — SODIUM CHLORIDE 0.9 % IV SOLN: INTRAVENOUS | Status: DC

## 2020-11-29 MED ORDER — SODIUM CHLORIDE 0.9 % IV SOLN
2.0000 g | Freq: Once | INTRAVENOUS | Status: AC
  Administered 2020-11-29: 2 g via INTRAVENOUS
  Filled 2020-11-29: qty 2

## 2020-11-29 MED ORDER — FLUCONAZOLE 100 MG PO TABS
200.0000 mg | ORAL_TABLET | Freq: Every day | ORAL | Status: DC
  Administered 2020-11-29 – 2020-12-06 (×8): 200 mg via ORAL
  Filled 2020-11-29 (×8): qty 2

## 2020-11-29 MED ORDER — ACETAMINOPHEN 325 MG PO TABS
650.0000 mg | ORAL_TABLET | Freq: Four times a day (QID) | ORAL | Status: DC | PRN
  Administered 2020-11-29: 650 mg via ORAL
  Filled 2020-11-29: qty 2

## 2020-11-29 NOTE — Progress Notes (Signed)
Inpatient Diabetes Program Recommendations  AACE/ADA: New Consensus Statement on Inpatient Glycemic Control   Target Ranges:  Prepandial:   less than 140 mg/dL      Peak postprandial:   less than 180 mg/dL (1-2 hours)      Critically ill patients:  140 - 180 mg/dL   Results for Desiree Espinoza, Desiree Espinoza (MRN [id number]) as of 11/29/2020 12:41  Ref. Range 11/28/2020 20:05 11/29/2020 09:43  Glucose Latest Ref Range: 70 - 99 mg/dL 676 (H) 720 (H)  Results for Desiree Espinoza, Desiree Espinoza (MRN [id number]) as of 11/29/2020 12:41  Ref. Range 12/17/2018 13:34  Hemoglobin A1C Latest Ref Range: 4.8 - 5.6 % 5.9 (H)   Review of Glycemic Control  Diabetes history: DM2 Outpatient Diabetes medications: Metformin 1000 mg BID Current orders for Inpatient glycemic control: Metformin 1000 mg BID  Inpatient Diabetes Program Recommendations:    Insulin: Please consider ordering CBGs with Novolog 0-9 units TID with meals and Novolog 0-5 units QHS.  Outpatient DM medications: Initial glucose 293 mg/dl on 6/62/94 and 765 mg/dl today on labs. In reviewing chart, noted on office visit note on 03/09/2019 that patient was prescribed Glipizide 10 mg BID and Metformin 1000 mg BID. May need to have additional DM medications prescribed at time of discharge.  Thanks, Orlando Penner, RN, MSN, CDE Diabetes Coordinator Inpatient Diabetes Program [phone] (Team Pager from 8am to 5pm)

## 2020-11-29 NOTE — ED Provider Notes (Addendum)
Patient's lab work has come back.  She still has a UTI with greater than 50 WBCs and RBCs.  Her lactic acid is elevated at 2.8.  She says she feels better.  She still has a rash which is consistent with a candidal infection on her buttocks and in the intertriginous areas of her groin.  CT of the abdomen shows thickening of the bladder wall in the ureters immediately adjacent to the bladder.  This could be due to a yeast infection as well or a bacterial infection.  I have called [facility] and requested a run sensitivities on both the bacteria and yeast that may grow out of the urine culture we sent earlier today.  I have also put sepsis orders in for her and added Diflucan 200 a day.  Additionally I spoke to [place] the nurse who reports that the last set of vitals are very suspect.  I should add that they were taken immediately after I left the room the patient did not look anywhere near that sick.  She is going to redo them now.  Regardless we will have to get her in the hospital for sepsis.  This might be due to Candida.  We may have to add additional antifungal antibiotics besides the Diflucan   Arnaldo Natal, MD 11/29/20 1448  Judeth Cornfield reports her blood pressure is actually 107 her sats are good.  I will give her 2 more liters of LR to complete the sepsis protocol.  She is already had 1 L of LR and 1 L of normal saline earlier today.  Her lactic acid was after the earlier liter of LR had finished.  We will consult the hospitalist group this patient admitted to the hospital.   Arnaldo Natal, MD 11/29/20 1451 Critical care time for this patient today by me is 1 hour.  This includes repeatedly visiting her reviewing her labs calling [facility] and our lab and checking with up-to-date and then the hospitalist.   Arnaldo Natal, MD 11/29/20 1452

## 2020-11-29 NOTE — ED Notes (Signed)
Dr. Dolores Frame notified of bp of 161/107

## 2020-11-29 NOTE — ED Notes (Signed)
Per pt daughter, the pt only ate a couple bites of her oatmeal and drank the container of grape juice, states she has not been eating at home either with a pour appetite. Pt states she is having some nausea.

## 2020-11-29 NOTE — H&P (Addendum)
History and Physical    Desiree Espinoza KPV:[id number] DOB: Feb 13, 1956 DOA: 11/28/2020  PCP: Preston Fleeting, MD  Patient coming from: home    Chief Complaint: urinary frequency  HPI: 65 y/o F w/ PMH of multiple sclerosis, DM2, HLD who presents w/ urinary frequency x 2 days. Pt denies any dysuria, urinary urgency. Pt was recently treated for UTI & yeast infection but pt does not remember what she took. Pt denies any fever, chills, sweating, cough, chest pain, shortness of breath, nausea, vomiting, abd pain, diarrhea, or constipation. Of note pt is wheelchair bound and does not ambulate. Pt lives at home w/ her husband.    Review of Systems: As per HPI otherwise 14 point review of systems negative.    Past Medical History:  Diagnosis Date  . Choledocholithiasis 01/11/2018  . Diabetes mellitus without complication (HCC)   . Hyperlipemia   . MS (multiple sclerosis) (HCC)   . Neuromuscular disorder (HCC)   . Thyroid disease     Past Surgical History:  Procedure Laterality Date  . CHOLECYSTECTOMY N/A 02/05/2018   Procedure: LAPAROSCOPIC CHOLECYSTECTOMY;  Surgeon: Henrene Dodge, MD;  Location: [facility] ORS;  Service: General;  Laterality: N/A;  . ENDOSCOPIC RETROGRADE CHOLANGIOPANCREATOGRAPHY (ERCP) WITH PROPOFOL N/A 01/12/2018   Procedure: ENDOSCOPIC RETROGRADE CHOLANGIOPANCREATOGRAPHY (ERCP) WITH PROPOFOL;  Surgeon: Midge Minium, MD;  Location: [facility] ENDOSCOPY;  Service: Endoscopy;  Laterality: N/A;     reports that she has never smoked. She has never used smokeless tobacco. She reports that she does not drink alcohol and does not use drugs.  No Known Allergies  Family History  Problem Relation Age of Onset  . Diabetes Mother   . Throat cancer Father   . Breast cancer Sister   . Breast cancer Sister     Prior to Admission medications   Medication Sig Start Date End Date Taking? Authorizing Provider  acetaminophen (TYLENOL) 325 MG tablet Take 2 tablets (650 mg total) by mouth  every 6 (six) hours as needed for mild pain (or Fever >/= 101). 10/30/18   Gouru, Deanna Artis, MD  atorvastatin (LIPITOR) 40 MG tablet Take 40 mg by mouth at bedtime.  12/19/17   [provider]  bisacodyl (DULCOLAX) 5 MG EC tablet Take 5 mg by mouth daily as needed for moderate constipation.    [provider]  clotrimazole-betamethasone (LOTRISONE) cream Apply 1 application topically 2 (two) times daily. 09/15/20   Minna Antis, MD  diclofenac sodium (VOLTAREN) 1 % GEL Apply 2 g topically 4 (four) times daily as needed for pain.    [provider]  docusate sodium (COLACE) 100 MG capsule Take 100 mg by mouth 2 (two) times daily.    [provider]  ipratropium-albuterol (DUONEB) 0.5-2.5 (3) MG/3ML SOLN Take 3 mLs by nebulization 2 (two) times daily. 12/21/18   Alford Highland, MD  metFORMIN (GLUCOPHAGE) 1000 MG tablet Take 1,000 mg by mouth 2 (two) times daily with a meal.    [provider]  Multiple Vitamin (MULTI-VITAMIN DAILY) TABS Take 1 tablet by mouth daily.    [provider]  ondansetron (ZOFRAN) 4 MG tablet Take 1 tablet (4 mg total) by mouth every 6 (six) hours as needed for nausea. 10/30/18   Ramonita Lab, MD  Polyethylene Glycol POWD Take 17 g by mouth daily. 02/19/18   Lattie Haw, MD    Physical Exam: Vitals:   11/29/20 1230 11/29/20 1403 11/29/20 1430 11/29/20 1446  BP: (!) 91/58 (!) 99/54 (!) 75/53 107/70  Pulse: Marland Kitchen)  111 83 (!) 102 (!) 110  Resp:  16  17  Temp:      TempSrc:      SpO2: 96% 94% 96% 97%  Weight:      Height:        Constitutional: NAD, calm, comfortable Vitals:   11/29/20 1230 11/29/20 1403 11/29/20 1430 11/29/20 1446  BP: (!) 91/58 (!) 99/54 (!) 75/53 107/70  Pulse: (!) 111 83 (!) 102 (!) 110  Resp:  16  17  Temp:      TempSrc:      SpO2: 96% 94% 96% 97%  Weight:      Height:       Eyes: PERRL, lids and conjunctivae normal ENMT: Mucous membranes are moist. Neck: normal,  supple, Respiratory: clear to auscultation bilaterally, no wheezing, no crackles. Normal respiratory effort. No accessory muscle use.  Cardiovascular: S1/S2+. No rubs or gallops or clicks.  Abdomen: soft, NT, ND, & hypoactive bowel sounds  Musculoskeletal: no clubbing / cyanosis. No joint deformity upper and lower extremities. Decreased ROM of b/l LE Skin:  lesions, ulcers. No induration Neurologic: CN 2-12 grossly intact.  Psychiatric: Abnormal judgment and insight. Normal mood and affect    Labs on Admission: I have personally reviewed following labs and imaging studies  CBC: Recent Labs  Lab 11/28/20 2005 11/29/20 0943  WBC 8.6 9.7  NEUTROABS 4.7 7.4  HGB 14.9 13.1  HCT 46.2* 42.2  MCV 83.4 83.4  PLT 434* 377   Basic Metabolic Panel: Recent Labs  Lab 11/28/20 2005 11/29/20 0943  NA 135 134*  K 4.1 4.3  CL 100 97*  CO2 22 24  GLUCOSE 293* 330*  BUN 6* 8  CREATININE 0.43* 0.37*  CALCIUM 9.2 8.8*   GFR: Estimated Creatinine Clearance: 65.6 mL/min (A) (by C-G formula based on SCr of 0.37 mg/dL (L)). Liver Function Tests: Recent Labs  Lab 11/28/20 2005 11/29/20 0943  AST 20 20  ALT 20 21  ALKPHOS 94 93  BILITOT 0.8 1.1  PROT 8.3* 7.6  ALBUMIN 3.7 3.5   No results for input(s): LIPASE, AMYLASE in the last 168 hours. No results for input(s): AMMONIA in the last 168 hours. Coagulation Profile: No results for input(s): INR, PROTIME in the last 168 hours. Cardiac Enzymes: No results for input(s): CKTOTAL, CKMB, CKMBINDEX, TROPONINI in the last 168 hours. BNP (last 3 results) No results for input(s): PROBNP in the last 8760 hours. HbA1C: No results for input(s): HGBA1C in the last 72 hours. CBG: Recent Labs  Lab 11/29/20 1418  GLUCAP 235*   Lipid Profile: No results for input(s): CHOL, HDL, LDLCALC, TRIG, CHOLHDL, LDLDIRECT in the last 72 hours. Thyroid Function Tests: No results for input(s): TSH, T4TOTAL, FREET4, T3FREE, THYROIDAB in the last 72  hours. Anemia Panel: No results for input(s): VITAMINB12, FOLATE, FERRITIN, TIBC, IRON, RETICCTPCT in the last 72 hours. Urine analysis:    Component Value Date/Time   COLORURINE AMBER (A) 11/29/2020 1005   APPEARANCEUR TURBID (A) 11/29/2020 1005   LABSPEC 1.026 11/29/2020 1005   PHURINE 5.0 11/29/2020 1005   GLUCOSEU >=500 (A) 11/29/2020 1005   HGBUR SMALL (A) 11/29/2020 1005   BILIRUBINUR NEGATIVE 11/29/2020 1005   KETONESUR 20 (A) 11/29/2020 1005   PROTEINUR 100 (A) 11/29/2020 1005   NITRITE NEGATIVE 11/29/2020 1005   LEUKOCYTESUR LARGE (A) 11/29/2020 1005    Radiological Exams on Admission: DG ABD ACUTE 2+V W 1V CHEST  Result Date: 11/29/2020 CLINICAL DATA:  Nausea and loss of appetite EXAM:  DG ABDOMEN ACUTE WITH 1 VIEW CHEST COMPARISON:  Chest radiograph December 17, 2018 FINDINGS: AP chest: Lungs are clear. Heart size and pulmonary vascularity are normal. No adenopathy. Supine and upright abdomen: There is moderate stool in the colon. There is no bowel dilatation or air-fluid level to suggest bowel obstruction. No free air. There are surgical clips in the gallbladder fossa region. No abnormal calcifications. IMPRESSION: Moderate stool in colon. No bowel obstruction or free air. Lungs clear. Electronically Signed   By: Bretta Bang III M.D.   On: 11/29/2020 11:40   CT Renal Stone Study  Result Date: 11/29/2020 CLINICAL DATA:  65 year old female with hematuria and UTI. EXAM: CT ABDOMEN AND PELVIS WITHOUT CONTRAST TECHNIQUE: Multidetector CT imaging of the abdomen and pelvis was performed following the standard protocol without IV contrast. COMPARISON:  None. FINDINGS: Please note that parenchymal abnormalities may be missed without intravenous contrast. Lower chest: Bibasilar subsegmental atelectasis/scarring is noted. Hepatobiliary: The liver is unremarkable. The patient is status post cholecystectomy. No biliary dilatation. Pancreas: Unremarkable Spleen: Unremarkable  Adrenals/Urinary Tract: Circumferential wall thickening of the bladder with adjacent stranding/inflammation likely represents infection. There is equivocal wall thickening of the ureters likely representing infection as well. There is no evidence of hydronephrosis, urinary gas or urinary calculi. The kidneys are otherwise unremarkable. The adrenal glands are unremarkable. Stomach/Bowel: Stomach is within normal limits. Appendix appears normal. No evidence of bowel wall thickening, distention, or inflammatory changes. Vascular/Lymphatic: Aortic atherosclerosis. No enlarged abdominal or pelvic lymph nodes. Reproductive: Uterus and bilateral adnexa are unremarkable. Other: No ascites, focal collection or pneumoperitoneum. Musculoskeletal: No acute or suspicious bony abnormalities. IMPRESSION: 1. Circumferential wall thickening of the bladder with adjacent stranding/inflammation likely representing infection. Equivocal wall thickening of the ureters likely representing infection/UTI as well. No evidence of hydronephrosis, urinary gas or urinary calculi. 2. Aortic Atherosclerosis (ICD10-I70.0). Electronically Signed   By: Harmon Pier M.D.   On: 11/29/2020 14:18    EKG: Independently reviewed.   Assessment/Plan Active Problems:   UTI (urinary tract infection)  Sepsis: w/ hypotension, tachycardia, elevated lactic acid & likely UTI &/or yeast infection. Will switch abx to IV ceftriaxone and continue on fluconazole  UTI: UA is positive. Urine cx is pending. Switched to IV ceftriaxone  Possible yeast infection: continue on fluconazole  Lactic acidosis: continue on IVFs. Repeat lactic acid ordered  DM2: likely poorly controlled, HbA1c ordered. EWill start SSI w/ accuchecks. Carb modified diet  MS: not on any meds as per med rec. Will consult pharmacy to verify med rec. Wheelchair bound. PT/OT consulted, pt's daughter previously requested pt to be placed in SNF as pt's husband is handicapped as well & no  longer able to take care of the pt   HLD: continue on statin    DVT prophylaxis: lovenox Code Status: full  Family Communication: discussed pt's care w/ pt's daughter, Raquel and answer her questions Disposition Plan: d/c to SNF vs home health  Consults called: none Admission status: inpatient/medsurg   Charise Killian MD   If 7PM-7AM, please contact night-coverage   11/29/2020, 4:33 PM

## 2020-11-29 NOTE — Progress Notes (Signed)
CODE SEPSIS - PHARMACY COMMUNICATION  **Broad Spectrum Antibiotics should be administered within 1 hour of Sepsis diagnosis**  Time Code Sepsis Called/Page Received: 1446  Antibiotics Ordered: cefepime  Time of 1st antibiotic administration: 1505    Pricilla Riffle ,PharmD Clinical Pharmacist  11/29/2020  3:37 PM

## 2020-11-29 NOTE — Progress Notes (Deleted)
   11/29/20 2251  Assess: MEWS Score  Temp (!) 97.5 F (36.4 C)

## 2020-11-29 NOTE — ED Provider Notes (Addendum)
Emergency Medicine Observation Re-evaluation Note  Desiree Espinoza is a 65 y.o. female, seen on rounds today.  Pt initially presented to the ED for complaints of Rash Currently, the patient is awaiting placement in a skilled nursing facility  Physical Exam  BP 97/83 (BP Location: Right Arm)   Pulse (!) 113   Temp 98.9 F (37.2 C) (Oral)   Resp 16   Ht 5\' 5"  (1.651 m)   Wt 63.5 kg   SpO2 95%   BMI 23.30 kg/m  Physical Exam General: Patient resting comfortably in bed says she feels quite well Cardiac: Regular rate and rhythm no audible murmurs Lungs: Lungs are clear Abdomen is soft and nontender There is no organomegaly Extremities: Nontender no edema  ED Course / MDM  EKG:   Patient is somewhat tachycardic although not febrile and her blood pressure is lower today than it has been.  Will check another set of vitals again shortly.  If this continues we will have to do further evaluation.  Blood pressure may just be done because of her being just awakened from sleep.   Plan  Patient is awaiting placement in skilled nursing facility   , MD 11/29/20 12/01/20    1194, MD 11/29/20 [phone]

## 2020-11-29 NOTE — ED Notes (Signed)
Patient is complaining of headache.  This RN is administering PRN tylenol for this complaint at this time.

## 2020-11-29 NOTE — Progress Notes (Signed)
PT Cancellation Note  Patient Details Name: Desiree Espinoza MRN: [id number] DOB: 1955-12-17   Cancelled Treatment:    Reason Eval/Treat Not Completed: PT screened, no needs identified, will sign off (Consult received and chart reviewed.  Patient with history of MS, requiring significant assist for ADLs, mobility in the home; currently presented to ER for management of rash/yeast infection and UTI.  Per discussion with patient and daughter at bedside, patient bed-bound, total care at baseline for at least the past 2 years.  Husband assists with transfers by "picking me up" for occasional transfers to manual [facility] (daughter reporting patient's husband provides full/total assist), but patient remains in bed majority of the time.  Patient able to feed self with set up/supervision, but very limited ability to assist with rolling and repositioning in bed; relies on depends/undergarments for toileting needs and completes all ADLs at bed-level (unable to sit unsupported).  Patient/daughter report husband experiencing progressive difficulty managing patient's care needs in the home due to his own health issues (not related to acute decline/change in patient's functional abilities).    Appears to be at baseline level of functional ability; no acute change in functional ability reported or identified.  No acute PT needs at this time; may benefit from transition to LTC for additional care needs.  Patient and daughter voice agreement and understanding.  If patient discharges home, may benefit from hospital bed and hoyer lift and aide/PCA services (if available) to better manage care in home.  Aidaly Cordner H. Manson Passey, PT, DPT, NCS 11/29/20, 9:44 AM [phone]

## 2020-11-29 NOTE — Sepsis Progress Note (Signed)
Notified bedside nurse of need to draw repeat lactic acid after Fluid Resuscitation is completed. Marland Kitchen

## 2020-11-29 NOTE — ED Provider Notes (Signed)
Patient remains hypotensive.  She is slightly less tachycardic though.  I will give her some fluid and check labs again.  She had a fairly robust UTI going on she has had Diflucan and amoxicillin already we will see if this is improved at all.   Arnaldo Natal, MD 11/29/20 0900

## 2020-11-29 NOTE — Consult Note (Signed)
PHARMACY -  BRIEF ANTIBIOTIC NOTE   Pharmacy has received consult(s) for Cefepime from an ED provider.  The patient's profile has been reviewed for ht/wt/allergies/indication/available labs.    One time order(s) placed for Cefepime 2 gram   Further antibiotics/pharmacy consults should be ordered by admitting physician if indicated.                       Thank you, Sharen Hones, PharmD, BCPS Clinical Pharmacist  11/29/2020  2:56 PM

## 2020-11-29 NOTE — ED Notes (Signed)
Pt diaper and linens changed.

## 2020-11-29 NOTE — ED Notes (Signed)
RN rounded on pt. Pt laying in bed. RN talked with pt and she states she does not need to be changed at this time, but would like the lights turned off for comfort. Lights turned off at this time.

## 2020-11-30 DIAGNOSIS — D72828 Other elevated white blood cell count: Secondary | ICD-10-CM

## 2020-11-30 LAB — GLUCOSE, CAPILLARY
Glucose-Capillary: 183 mg/dL — ABNORMAL HIGH (ref 70–99)
Glucose-Capillary: 213 mg/dL — ABNORMAL HIGH (ref 70–99)
Glucose-Capillary: 154 mg/dL — ABNORMAL HIGH (ref 70–99)
Glucose-Capillary: 190 mg/dL — ABNORMAL HIGH (ref 70–99)

## 2020-11-30 LAB — CBC
HCT: 36.6 % (ref 36.0–46.0)
Hemoglobin: 12 g/dL (ref 12.0–15.0)
MCH: 27 pg (ref 26.0–34.0)
MCHC: 32.8 g/dL (ref 30.0–36.0)
MCV: 82.4 fL (ref 80.0–100.0)
Platelets: 339 10*3/uL (ref 150–400)
RBC: 4.44 MIL/uL (ref 3.87–5.11)
RDW: 15.4 % (ref 11.5–15.5)
WBC: 10.6 10*3/uL — ABNORMAL HIGH (ref 4.0–10.5)
nRBC: 0 % (ref 0.0–0.2)

## 2020-11-30 LAB — BASIC METABOLIC PANEL
Anion gap: 10 (ref 5–15)
BUN: <5 mg/dL — ABNORMAL LOW (ref 8–23)
CO2: 24 mmol/L (ref 22–32)
Calcium: 7.9 mg/dL — ABNORMAL LOW (ref 8.9–10.3)
Chloride: 104 mmol/L (ref 98–111)
Creatinine, Ser: 0.46 mg/dL (ref 0.44–1.00)
GFR, Estimated: >60 mL/min (ref >60)
Glucose, Bld: 187 mg/dL — ABNORMAL HIGH (ref 70–99)
Potassium: 3.5 mmol/L (ref 3.5–5.1)
Sodium: 138 mmol/L (ref 135–145)

## 2020-11-30 LAB — MAGNESIUM: Magnesium: 1.5 mg/dL — ABNORMAL LOW (ref 1.7–2.4)

## 2020-11-30 LAB — HIV ANTIBODY (ROUTINE TESTING W REFLEX): HIV Screen 4th Generation wRfx: NONREACTIVE

## 2020-11-30 LAB — LACTIC ACID, PLASMA: Lactic Acid, Venous: 1.1 mmol/L (ref 0.5–1.9)

## 2020-11-30 LAB — HEMOGLOBIN A1C
Hgb A1c MFr Bld: 10.8 % — ABNORMAL HIGH (ref 4.8–5.6)
Mean Plasma Glucose: 263.26 mg/dL

## 2020-11-30 MED ORDER — MAGNESIUM SULFATE 2 GM/50ML IV SOLN
2.0000 g | Freq: Once | INTRAVENOUS | Status: AC
  Administered 2020-11-30: 2 g via INTRAVENOUS
  Filled 2020-11-30: qty 50

## 2020-11-30 MED ORDER — SODIUM CHLORIDE 0.9 % IV BOLUS
250.0000 mL | Freq: Once | INTRAVENOUS | Status: AC
  Administered 2020-11-30: 250 mL via INTRAVENOUS

## 2020-11-30 MED ORDER — METOPROLOL TARTRATE 5 MG/5ML IV SOLN: 5.0000 mg | Freq: Four times a day (QID) | INTRAVENOUS | Status: DC | PRN

## 2020-11-30 MED ORDER — METOPROLOL TARTRATE 5 MG/5ML IV SOLN
2.5000 mg | Freq: Once | INTRAVENOUS | Status: AC
  Administered 2020-11-30: 2.5 mg via INTRAVENOUS
  Filled 2020-11-30: qty 5

## 2020-11-30 NOTE — Progress Notes (Signed)
PROGRESS NOTE    Desiree Espinoza  OAC:[id number] DOB: 1955/11/30 DOA: 11/28/2020 PCP: Preston Fleeting, MD   Assessment & Plan:   Active Problems:   UTI (urinary tract infection)   Sepsis: w/ hypotension, tachycardia, elevated lactic acid & likely UTI &/or yeast infection. Will continue on IV ceftriaxone and fluconazole   UTI: UA is positive. Urine cx is pending. Continue on IV ceftriaxone   Possible yeast infection: continue on fluconazole  Leukocytosis: reactive vs infection. Continue on IV abxs   Lactic acidosis: resolved  DM2: poorly controlled, HbA1c 10.8. Continue on SSI w/ accuchecks. Carb modified diet   MS: not on any meds as per med rec.Wheelchair bound. PT/OT consulted, pt's daughter previously requested pt to be placed in SNF as pt's husband is handicapped as well & no longer able to take care of the pt   HLD: continue on statin  DVT prophylaxis: lovenox Code Status: full  Family Communication:  Disposition Plan: (specify when and where you expect patient to be discharged). Include barriers to DC in this tab.  Level of care: Med-Surg    Status is: Inpatient  Remains inpatient appropriate because:Ongoing diagnostic testing needed not appropriate for outpatient work up, Unsafe d/c plan and IV treatments appropriate due to intensity of illness or inability to take PO   Dispo: The patient is from: Home              Anticipated d/c is to: SNF              Anticipated d/c date is: 3 days              Patient currently is not medically stable to d/c.   Difficult to place patient Yes          Consultants:      Procedures:    Antimicrobials: ceftriaxone   Subjective: Pt c/o fatigue  Objective: Vitals:   11/30/20 0043 11/30/20 0426 11/30/20 0731 11/30/20 1114  BP: (!) 105/57 (!) 108/51 118/75 (!) 104/49  Pulse: (!) 111 (!) 120 (!) 116 (!) 108  Resp: 18 17 16 16   Temp: 97.9 F (36.6 C) 97.9 F (36.6 C) 97.7 F (36.5 C) 97.6 F  (36.4 C)  TempSrc: Oral Oral Oral Oral  SpO2: 98% 98% 97% 98%  Weight:      Height:        Intake/Output Summary (Last 24 hours) at 11/30/2020 1214 Last data filed at 11/30/2020 1102 Gross per 24 hour  Intake 3250.06 ml  Output 1600 ml  Net 1650.06 ml   Filed Weights   11/28/20 2002  Weight: 63.5 kg    Examination:  General exam: Appears calm and comfortable  Respiratory system: Clear to auscultation. Respiratory effort normal. Cardiovascular system: S1 & S2 +. No  rubs, gallops or clicks.  Gastrointestinal system: Abdomen is nondistended, soft and nontender. Normal bowel sounds heard. Central nervous system: Alert and oriented. Moves all 4 extremities Psychiatry: Judgement and insight appear normal. Mood & affect appropriate.     Data Reviewed: I have personally reviewed following labs and imaging studies  CBC: Recent Labs  Lab 11/28/20 2005 11/29/20 0943 11/30/20 0405  WBC 8.6 9.7 10.6*  NEUTROABS 4.7 7.4  --   HGB 14.9 13.1 12.0  HCT 46.2* 42.2 36.6  MCV 83.4 83.4 82.4  PLT 434* 377 339   Basic Metabolic Panel: Recent Labs  Lab 11/28/20 2005 11/29/20 0943 11/29/20 1826 11/30/20 0405 11/30/20 0823  NA 135 134* 141  --  138  K 4.1 4.3 3.6  --  3.5  CL 100 97* 106  --  104  CO2 22 24 25   --  24  GLUCOSE 293* 330* 190*  --  187*  BUN 6* 8 <5*  --  <5*  CREATININE 0.43* 0.37* 0.40*  --  0.46  CALCIUM 9.2 8.8* 8.5*  --  7.9*  MG  --   --   --  1.5*  --    GFR: Estimated Creatinine Clearance: 65.6 mL/min (by C-G formula based on SCr of 0.46 mg/dL). Liver Function Tests: Recent Labs  Lab 11/28/20 2005 11/29/20 0943 11/29/20 1826  AST 20 20 21   ALT 20 21 17   ALKPHOS 94 93 77  BILITOT 0.8 1.1 0.8  PROT 8.3* 7.6 7.2  ALBUMIN 3.7 3.5 3.1*   No results for input(s): LIPASE, AMYLASE in the last 168 hours. No results for input(s): AMMONIA in the last 168 hours. Coagulation Profile: No results for input(s): INR, PROTIME in the last 168  hours. Cardiac Enzymes: No results for input(s): CKTOTAL, CKMB, CKMBINDEX, TROPONINI in the last 168 hours. BNP (last 3 results) No results for input(s): PROBNP in the last 8760 hours. HbA1C: Recent Labs    11/30/20 0405  HGBA1C 10.8*   CBG: Recent Labs  Lab 11/29/20 1418 11/29/20 1734 11/30/20 0759  GLUCAP 235* 170* 183*   Lipid Profile: No results for input(s): CHOL, HDL, LDLCALC, TRIG, CHOLHDL, LDLDIRECT in the last 72 hours. Thyroid Function Tests: No results for input(s): TSH, T4TOTAL, FREET4, T3FREE, THYROIDAB in the last 72 hours. Anemia Panel: No results for input(s): VITAMINB12, FOLATE, FERRITIN, TIBC, IRON, RETICCTPCT in the last 72 hours. Sepsis Labs: Recent Labs  Lab 11/29/20 1323 11/29/20 1421 11/29/20 1826 11/30/20 0823  LATICACIDVEN 2.8* 2.7* 2.6* 1.1    Recent Results (from the past 240 hour(s))  SARS CORONAVIRUS 2 (TAT 6-24 HRS) Nasopharyngeal Nasopharyngeal Swab     Status: None   Collection Time: 11/29/20  6:02 AM   Specimen: Nasopharyngeal Swab  Result Value Ref Range Status   SARS Coronavirus 2 NEGATIVE NEGATIVE Final    Comment: (NOTE) SARS-CoV-2 target nucleic acids are NOT DETECTED.  The SARS-CoV-2 RNA is generally detectable in upper and lower respiratory specimens during the acute phase of infection. Negative results do not preclude SARS-CoV-2 infection, do not rule out co-infections with other pathogens, and should not be used as the sole basis for treatment or other patient management decisions. Negative results must be combined with clinical observations, patient history, and epidemiological information. The expected result is Negative.  Fact Sheet for Patients: HairSlick.no  Fact Sheet for Healthcare Providers: quierodirigir.com  This test is not yet approved or cleared by the [place] FDA and  has been authorized for detection and/or diagnosis of SARS-CoV-2 by FDA  under an Emergency Use Authorization (EUA). This EUA will remain  in effect (meaning this test can be used) for the duration of the COVID-19 declaration under Se ction 564(b)(1) of the Act, 21 U.S.C. section 360bbb-3(b)(1), unless the authorization is terminated or revoked sooner.  Performed at [facility], [address] [place], [place] [address]   Urine culture     Status: Abnormal (Preliminary result)   Collection Time: 11/29/20 10:05 AM   Specimen: Urine, Random  Result Value Ref Range Status   Specimen Description   Final    URINE, RANDOM Performed at [facility], [address] [place], [place] [address]    Special Requests  Final    NONE Performed at [facility], [address] [place], [place] [address]    Culture (A)  Final    70,000 COLONIES/mL YEAST CULTURE REINCUBATED FOR BETTER GROWTH Performed at [facility], [address], [place], [place] [address]    Report Status PENDING  Incomplete  Culture, blood (routine x 2)     Status: None (Preliminary result)   Collection Time: 11/29/20 10:53 AM   Specimen: BLOOD  Result Value Ref Range Status   Specimen Description BLOOD LEFT ANTECUBITAL  Final   Special Requests   Final    BOTTLES DRAWN AEROBIC AND ANAEROBIC Blood Culture results may not be optimal due to an excessive volume of blood received in culture bottles   Culture   Final    NO GROWTH < 24 HOURS Performed at [facility], [address], [place], [place] [address]    Report Status PENDING  Incomplete  Culture, blood (routine x 2)     Status: None (Preliminary result)   Collection Time: 11/29/20  2:21 PM   Specimen: BLOOD  Result Value Ref Range Status   Specimen Description BLOOD LEFT ANTECUBITAL  Final   Special Requests   Final    BOTTLES DRAWN AEROBIC AND ANAEROBIC Blood Culture adequate volume   Culture   Final    NO GROWTH < 24 HOURS Performed at [facility], [address]  [place], [place] [address]    Report Status PENDING  Incomplete         Radiology Studies: DG ABD ACUTE 2+V W 1V CHEST  Result Date: 11/29/2020 CLINICAL DATA:  Nausea and loss of appetite EXAM: DG ABDOMEN ACUTE WITH 1 VIEW CHEST COMPARISON:  Chest radiograph December 17, 2018 FINDINGS: AP chest: Lungs are clear. Heart size and pulmonary vascularity are normal. No adenopathy. Supine and upright abdomen: There is moderate stool in the colon. There is no bowel dilatation or air-fluid level to suggest bowel obstruction. No free air. There are surgical clips in the gallbladder fossa region. No abnormal calcifications. IMPRESSION: Moderate stool in colon. No bowel obstruction or free air. Lungs clear. Electronically Signed   By: Bretta Bang III M.D.   On: 11/29/2020 11:40   CT Renal Stone Study  Result Date: 11/29/2020 CLINICAL DATA:  65 year old female with hematuria and UTI. EXAM: CT ABDOMEN AND PELVIS WITHOUT CONTRAST TECHNIQUE: Multidetector CT imaging of the abdomen and pelvis was performed following the standard protocol without IV contrast. COMPARISON:  None. FINDINGS: Please note that parenchymal abnormalities may be missed without intravenous contrast. Lower chest: Bibasilar subsegmental atelectasis/scarring is noted. Hepatobiliary: The liver is unremarkable. The patient is status post cholecystectomy. No biliary dilatation. Pancreas: Unremarkable Spleen: Unremarkable Adrenals/Urinary Tract: Circumferential wall thickening of the bladder with adjacent stranding/inflammation likely represents infection. There is equivocal wall thickening of the ureters likely representing infection as well. There is no evidence of hydronephrosis, urinary gas or urinary calculi. The kidneys are otherwise unremarkable. The adrenal glands are unremarkable. Stomach/Bowel: Stomach is within normal limits. Appendix appears normal. No evidence of bowel wall thickening, distention, or inflammatory changes.  Vascular/Lymphatic: Aortic atherosclerosis. No enlarged abdominal or pelvic lymph nodes. Reproductive: Uterus and bilateral adnexa are unremarkable. Other: No ascites, focal collection or pneumoperitoneum. Musculoskeletal: No acute or suspicious bony abnormalities. IMPRESSION: 1. Circumferential wall thickening of the bladder with adjacent stranding/inflammation likely representing infection. Equivocal wall thickening of the ureters likely representing infection/UTI as well. No evidence of hydronephrosis, urinary gas or urinary calculi.  2. Aortic Atherosclerosis (ICD10-I70.0). Electronically Signed   By: Harmon Pier M.D.   On: 11/29/2020 14:18        Scheduled Meds: . atorvastatin  40 mg Oral QHS  . docusate sodium  100 mg Oral BID  . enoxaparin (LOVENOX) injection  40 mg Subcutaneous Q24H  . fluconazole  200 mg Oral Daily  . insulin aspart  0-9 Units Subcutaneous TID WC  . nystatin ointment   Topical BID  . sodium chloride flush  3 mL Intravenous Q12H   Continuous Infusions: . sodium chloride 75 mL/hr at 11/29/20 2122  . cefTRIAXone (ROCEPHIN)  IV Stopped (11/29/20 2222)     LOS: 1 day    Time spent: 32 mins     Charise Killian, MD [facility] Pager [phone]  If 7PM-7AM, please contact night-coverage 11/30/2020, 12:14 PM

## 2020-11-30 NOTE — Progress Notes (Signed)
   11/29/20 2251  Assess: MEWS Score  Temp (!) 97.5 F (36.4 C)  BP 129/65  Pulse Rate (!) 124  Resp 16  SpO2 96 %  O2 Device Room Air  Assess: MEWS Score  MEWS Temp 0  MEWS Systolic 0  MEWS Pulse 2  MEWS RR 0  MEWS LOC 0  MEWS Score 2  MEWS Score Color Yellow  Assess: if the MEWS score is Yellow or Red  Were vital signs taken at a resting state? Yes  Focused Assessment No change from prior assessment  Early Detection of Sepsis Score *See Row Information* Medium  MEWS guidelines implemented *See Row Information* Yes  Treat  Pain Scale 0-10  Pain Score 0  Take Vital Signs  Increase Vital Sign Frequency  Yellow: Q 2hr X 2 then Q 4hr X 2, if remains yellow, continue Q 4hrs  Escalate  MEWS: Escalate Yellow: discuss with charge nurse/RN and consider discussing with provider and RRT  Notify: Charge Nurse/RN  Name of Charge Nurse/RN Notified Marcella RN   Date Charge Nurse/RN Notified 11/29/20  Time Charge Nurse/RN Notified 2314  Document  Patient Outcome Stabilized after interventions  Progress note created (see row info) Yes

## 2020-11-30 NOTE — Progress Notes (Signed)
MD notified of patient's yellow MEWS score due to her heart rate. New orders acknowledged and followed. Patient is resting comfortably with no complaints.

## 2020-11-30 NOTE — Progress Notes (Signed)
OT Cancellation Note  Patient Details Name: Desiree Espinoza MRN: [id number] DOB: 1956-02-26   Cancelled Treatment:    Reason Eval/Treat Not Completed: OT screened, no needs identified, will sign off  Upon discussion with pt and pt's daughter who is present in the room throughout, pt with no acute changes in functional ADL status on this hospital admission versus her baseline. OT assists pt and dtr to get spanish food menu so that daughter can help order meals as pt is unable to hold phone d/t decreased fine motor coordination. In addition, OT notifies CNA that pt will require assist at meal time for eating. Once appropriate resources put in place from an OT standpoint, no further acute needs are perceived. Will complete order at this time. Please re-consult should an acute need arise. Thank you.  Rejeana Brock, MS, OTR/L ascom [phone] 11/30/20, 3:21 PM

## 2020-12-01 DIAGNOSIS — E876 Hypokalemia: Secondary | ICD-10-CM

## 2020-12-01 LAB — CBC
HCT: 36.3 % (ref 36.0–46.0)
Hemoglobin: 11.5 g/dL — ABNORMAL LOW (ref 12.0–15.0)
MCH: 26.3 pg (ref 26.0–34.0)
MCHC: 31.7 g/dL (ref 30.0–36.0)
MCV: 83.1 fL (ref 80.0–100.0)
Platelets: 338 10*3/uL (ref 150–400)
RBC: 4.37 MIL/uL (ref 3.87–5.11)
RDW: 15.3 % (ref 11.5–15.5)
WBC: 8.7 10*3/uL (ref 4.0–10.5)
nRBC: 0 % (ref 0.0–0.2)

## 2020-12-01 LAB — BASIC METABOLIC PANEL
Anion gap: 11 (ref 5–15)
BUN: <5 mg/dL — ABNORMAL LOW (ref 8–23)
CO2: 25 mmol/L (ref 22–32)
Calcium: 7.6 mg/dL — ABNORMAL LOW (ref 8.9–10.3)
Chloride: 105 mmol/L (ref 98–111)
Creatinine, Ser: 0.38 mg/dL — ABNORMAL LOW (ref 0.44–1.00)
GFR, Estimated: >60 mL/min (ref >60)
Glucose, Bld: 176 mg/dL — ABNORMAL HIGH (ref 70–99)
Potassium: 3.3 mmol/L — ABNORMAL LOW (ref 3.5–5.1)
Sodium: 141 mmol/L (ref 135–145)

## 2020-12-01 LAB — GLUCOSE, CAPILLARY
Glucose-Capillary: 168 mg/dL — ABNORMAL HIGH (ref 70–99)
Glucose-Capillary: 269 mg/dL — ABNORMAL HIGH (ref 70–99)
Glucose-Capillary: 204 mg/dL — ABNORMAL HIGH (ref 70–99)
Glucose-Capillary: 239 mg/dL — ABNORMAL HIGH (ref 70–99)

## 2020-12-01 LAB — MAGNESIUM: Magnesium: 1.9 mg/dL (ref 1.7–2.4)

## 2020-12-01 MED ORDER — POTASSIUM CHLORIDE CRYS ER 20 MEQ PO TBCR
20.0000 meq | EXTENDED_RELEASE_TABLET | Freq: Once | ORAL | Status: AC
  Administered 2020-12-01: 20 meq via ORAL
  Filled 2020-12-01 (×2): qty 1

## 2020-12-01 MED ORDER — ADULT MULTIVITAMIN W/MINERALS CH
1.0000 | ORAL_TABLET | Freq: Every day | ORAL | Status: DC
  Administered 2020-12-02 – 2020-12-06 (×5): 1 via ORAL
  Filled 2020-12-01 (×5): qty 1

## 2020-12-01 MED ORDER — ENSURE MAX PROTEIN PO LIQD
11.0000 [oz_av] | Freq: Two times a day (BID) | ORAL | Status: DC
  Administered 2020-12-01 – 2020-12-04 (×4): 11 [oz_av] via ORAL
  Filled 2020-12-01: qty 330

## 2020-12-01 NOTE — Progress Notes (Signed)
Initial Nutrition Assessment  DOCUMENTATION CODES:   Not applicable  INTERVENTION:   Ensure Max protein supplement BID, each supplement provides 150kcal and 30g of protein.  MVI po daily   NUTRITION DIAGNOSIS:   Increased nutrient needs related to chronic illness (multiple sclerosis) as evidenced by estimated needs.  GOAL:   Patient will meet greater than or equal to 90% of their needs  MONITOR:   PO intake,Supplement acceptance,Labs,Weight trends,Skin,I & O's  REASON FOR ASSESSMENT:   Consult Assessment of nutrition requirement/status  ASSESSMENT:   65 y/o female with h/o multiple sclerosis, DM2 and HLD who is admitted with UTI and sepsis   Met with pt in room today. Pt reports decreased appetite and oral intake for several days pta but reports that her appetite is improving in hospital. Pt is documented to be eating 100% of meals in hospital. RD discussed with pt the importance of adequate protein needed to preserve lean muscle. Pt is willing to drink vanilla Ensure in hospital. RD will add supplements and MVI to help pt meet her estimated needs. Per chart, pt appears weight stable at baseline.   Medications reviewed and include: colace, lovenox, diflucan, insulin  Labs reviewed: K 3.3(L), BUN <5(L), creat 0.38(L), Mg 1.9 cbgs- 168, 269 x 24 hrs  NUTRITION - FOCUSED PHYSICAL EXAM:  Flowsheet Row Most Recent Value  Orbital Region No depletion  Upper Arm Region No depletion  Thoracic and Lumbar Region No depletion  Buccal Region No depletion  Temple Region No depletion  Clavicle Bone Region No depletion  Clavicle and Acromion Bone Region No depletion  Scapular Bone Region No depletion  Dorsal Hand No depletion  Patellar Region Severe depletion  Anterior Thigh Region Severe depletion  Posterior Calf Region Severe depletion  Edema (RD Assessment) None  Hair Reviewed  Eyes Reviewed  Mouth Reviewed  Skin Reviewed  Nails Reviewed     Diet Order:   Diet Order             Diet Carb Modified Fluid consistency: Thin; Room service appropriate? Yes  Diet effective now                EDUCATION NEEDS:   Education needs have been addressed  Skin:  Skin Assessment: Reviewed RN Assessment  Last BM:  1/28- type 5  Height:   Ht Readings from Last 1 Encounters:  11/28/20 '5\' 5"'  (1.651 m)    Weight:   Wt Readings from Last 1 Encounters:  11/28/20 63.5 kg    Ideal Body Weight:  56.8 kg  BMI:  Body mass index is 23.3 kg/m.  Estimated Nutritional Needs:   Kcal:  1500-1700kcal/day  Protein:  75-85g/day  Fluid:  1.7L/day  Koleen Distance MS, RD, LDN Please refer to [facility] for RD and/or RD on-call/weekend/after hours pager

## 2020-12-01 NOTE — Plan of Care (Signed)

## 2020-12-01 NOTE — Progress Notes (Signed)
PROGRESS NOTE    Desiree Espinoza  ATF:[id number] DOB: 11/29/1955 DOA: 11/28/2020 PCP: Preston Fleeting, MD   Assessment & Plan:   Active Problems:   UTI (urinary tract infection)   Sepsis: w/ hypotension, tachycardia, elevated lactic acid & likely UTI &/or yeast infection. Resolved    Unlikely UTI: UA is positive. Urine cx is growing candida, sens pending which was sent to [facility]. D/c ceftriaxone   Yeast infection: urine cx growing candida, sens pending which was sent to [facility]. Continue on fluconazole   Hypokalemia: KCl repleted. Will continue to monitor   Leukocytosis: resolved  Lactic acidosis: resolved  DM2: HbA1c 10.8, poorly controlled. Continue on SSI w/ accuchecks.   MS: not on any meds. Wheelchair bound. [name] wants the pt to go to a long term SNF placement and CM is helping with that   HLD: continue on statin   DVT prophylaxis: lovenox Code Status: full  Family Communication:  Disposition Plan: possible SNF   Level of care: Med-Surg    Status is: Inpatient  Remains inpatient appropriate because:Ongoing diagnostic testing needed not appropriate for outpatient work up, Unsafe d/c plan and IV treatments appropriate due to intensity of illness or inability to take PO, awaiting urine cx results to finalize    Dispo: The patient is from: Home              Anticipated d/c is to: SNF              Anticipated d/c date is: 2 days              Patient currently is not medically stable to d/c.   Difficult to place patient Yes          Consultants:      Procedures:    Antimicrobials:    Subjective: Pt c/o generalized weakness  Objective: Vitals:   11/30/20 2013 12/01/20 0045 12/01/20 0634 12/01/20 0720  BP: 119/67 118/65 113/75 120/84  Pulse: (!) 117 (!) 118 (!) 111 (!) 107  Resp: 16 18 18 18   Temp: 99.4 F (37.4 C) 98.9 F (37.2 C) 98.3 F (36.8 C) 98.3 F (36.8 C)  TempSrc: Oral Oral Oral Oral  SpO2: 96% 96% 96%  95%  Weight:      Height:        Intake/Output Summary (Last 24 hours) at 12/01/2020 0755 Last data filed at 12/01/2020 0600 Gross per 24 hour  Intake 2588.48 ml  Output 2930 ml  Net -341.52 ml   Filed Weights   11/28/20 2002  Weight: 63.5 kg    Examination:  General exam: Appears comfortable  Respiratory system: clear breath sounds b/l  Cardiovascular system: S1/S2+. No clicks or rubs  Gastrointestinal system: Abd is soft, NT, ND & hypoactive bowel sounds  Central nervous system: Alert and oriented. Moves all 4 extremities  Psychiatry: Judgement and insight appear normal. Mood & affect appropriate.     Data Reviewed: I have personally reviewed following labs and imaging studies  CBC: Recent Labs  Lab 11/28/20 2005 11/29/20 0943 11/30/20 0405 12/01/20 0325  WBC 8.6 9.7 10.6* 8.7  NEUTROABS 4.7 7.4  --   --   HGB 14.9 13.1 12.0 11.5*  HCT 46.2* 42.2 36.6 36.3  MCV 83.4 83.4 82.4 83.1  PLT 434* 377 339 338   Basic Metabolic Panel: Recent Labs  Lab 11/28/20 2005 11/29/20 0943 11/29/20 1826 11/30/20 0405 11/30/20 0823 12/01/20 0325  NA 135 134* 141  --  138  141  K 4.1 4.3 3.6  --  3.5 3.3*  CL 100 97* 106  --  104 105  CO2 22 24 25   --  24 25  GLUCOSE 293* 330* 190*  --  187* 176*  BUN 6* 8 <5*  --  <5* <5*  CREATININE 0.43* 0.37* 0.40*  --  0.46 0.38*  CALCIUM 9.2 8.8* 8.5*  --  7.9* 7.6*  MG  --   --   --  1.5*  --  1.9   GFR: Estimated Creatinine Clearance: 65.6 mL/min (A) (by C-G formula based on SCr of 0.38 mg/dL (L)). Liver Function Tests: Recent Labs  Lab 11/28/20 2005 11/29/20 0943 11/29/20 1826  AST 20 20 21   ALT 20 21 17   ALKPHOS 94 93 77  BILITOT 0.8 1.1 0.8  PROT 8.3* 7.6 7.2  ALBUMIN 3.7 3.5 3.1*   No results for input(s): LIPASE, AMYLASE in the last 168 hours. No results for input(s): AMMONIA in the last 168 hours. Coagulation Profile: No results for input(s): INR, PROTIME in the last 168 hours. Cardiac Enzymes: No results for  input(s): CKTOTAL, CKMB, CKMBINDEX, TROPONINI in the last 168 hours. BNP (last 3 results) No results for input(s): PROBNP in the last 8760 hours. HbA1C: Recent Labs    11/30/20 0405  HGBA1C 10.8*   CBG: Recent Labs  Lab 11/29/20 1734 11/30/20 0759 11/30/20 1232 11/30/20 1641 11/30/20 2146  GLUCAP 170* 183* 213* 154* 190*   Lipid Profile: No results for input(s): CHOL, HDL, LDLCALC, TRIG, CHOLHDL, LDLDIRECT in the last 72 hours. Thyroid Function Tests: No results for input(s): TSH, T4TOTAL, FREET4, T3FREE, THYROIDAB in the last 72 hours. Anemia Panel: No results for input(s): VITAMINB12, FOLATE, FERRITIN, TIBC, IRON, RETICCTPCT in the last 72 hours. Sepsis Labs: Recent Labs  Lab 11/29/20 1323 11/29/20 1421 11/29/20 1826 11/30/20 0823  LATICACIDVEN 2.8* 2.7* 2.6* 1.1    Recent Results (from the past 240 hour(s))  SARS CORONAVIRUS 2 (TAT 6-24 HRS) Nasopharyngeal Nasopharyngeal Swab     Status: None   Collection Time: 11/29/20  6:02 AM   Specimen: Nasopharyngeal Swab  Result Value Ref Range Status   SARS Coronavirus 2 NEGATIVE NEGATIVE Final    Comment: (NOTE) SARS-CoV-2 target nucleic acids are NOT DETECTED.  The SARS-CoV-2 RNA is generally detectable in upper and lower respiratory specimens during the acute phase of infection. Negative results do not preclude SARS-CoV-2 infection, do not rule out co-infections with other pathogens, and should not be used as the sole basis for treatment or other patient management decisions. Negative results must be combined with clinical observations, patient history, and epidemiological information. The expected result is Negative.  Fact Sheet for Patients: 12/01/20  Fact Sheet for Healthcare Providers: 12/02/20  This test is not yet approved or cleared by the 12/01/20 FDA and  has been authorized for detection and/or diagnosis of SARS-CoV-2 by FDA under  an Emergency Use Authorization (EUA). This EUA will remain  in effect (meaning this test can be used) for the duration of the COVID-19 declaration under Se ction 564(b)(1) of the Act, 21 U.S.C. section 360bbb-3(b)(1), unless the authorization is terminated or revoked sooner.  Performed at [facility], [address] [place], [facility] [address]   Urine culture     Status: Abnormal (Preliminary result)   Collection Time: 11/29/20 10:05 AM   Specimen: Urine, Random  Result Value Ref Range Status   Specimen Description   Final    URINE, RANDOM Performed at  [facility], [address], [place], [place] [address]    Special Requests   Final    NONE Performed at [facility], [address] [place], [place] [address]    Culture (A)  Final    70,000 COLONIES/mL YEAST CULTURE REINCUBATED FOR BETTER GROWTH Performed at [facility], [address] [place], [place] [address]    Report Status PENDING  Incomplete  Culture, blood (routine x 2)     Status: None (Preliminary result)   Collection Time: 11/29/20 10:53 AM   Specimen: BLOOD  Result Value Ref Range Status   Specimen Description BLOOD LEFT ANTECUBITAL  Final   Special Requests   Final    BOTTLES DRAWN AEROBIC AND ANAEROBIC Blood Culture results may not be optimal due to an excessive volume of blood received in culture bottles   Culture   Final    NO GROWTH 2 DAYS Performed at [facility], [address] [place], [place] [address]    Report Status PENDING  Incomplete  Culture, blood (routine x 2)     Status: None (Preliminary result)   Collection Time: 11/29/20  2:21 PM   Specimen: BLOOD  Result Value Ref Range Status   Specimen Description BLOOD LEFT ANTECUBITAL  Final   Special Requests   Final    BOTTLES DRAWN AEROBIC AND ANAEROBIC Blood Culture adequate volume   Culture   Final    NO GROWTH 2 DAYS Performed at [facility], [address] [place], [place]  [address]    Report Status PENDING  Incomplete         Radiology Studies: DG ABD ACUTE 2+V W 1V CHEST  Result Date: 11/29/2020 CLINICAL DATA:  Nausea and loss of appetite EXAM: DG ABDOMEN ACUTE WITH 1 VIEW CHEST COMPARISON:  Chest radiograph December 17, 2018 FINDINGS: AP chest: Lungs are clear. Heart size and pulmonary vascularity are normal. No adenopathy. Supine and upright abdomen: There is moderate stool in the colon. There is no bowel dilatation or air-fluid level to suggest bowel obstruction. No free air. There are surgical clips in the gallbladder fossa region. No abnormal calcifications. IMPRESSION: Moderate stool in colon. No bowel obstruction or free air. Lungs clear. Electronically Signed   By: Bretta Bang III M.D.   On: 11/29/2020 11:40   CT Renal Stone Study  Result Date: 11/29/2020 CLINICAL DATA:  65 year old female with hematuria and UTI. EXAM: CT ABDOMEN AND PELVIS WITHOUT CONTRAST TECHNIQUE: Multidetector CT imaging of the abdomen and pelvis was performed following the standard protocol without IV contrast. COMPARISON:  None. FINDINGS: Please note that parenchymal abnormalities may be missed without intravenous contrast. Lower chest: Bibasilar subsegmental atelectasis/scarring is noted. Hepatobiliary: The liver is unremarkable. The patient is status post cholecystectomy. No biliary dilatation. Pancreas: Unremarkable Spleen: Unremarkable Adrenals/Urinary Tract: Circumferential wall thickening of the bladder with adjacent stranding/inflammation likely represents infection. There is equivocal wall thickening of the ureters likely representing infection as well. There is no evidence of hydronephrosis, urinary gas or urinary calculi. The kidneys are otherwise unremarkable. The adrenal glands are unremarkable. Stomach/Bowel: Stomach is within normal limits. Appendix appears normal. No evidence of bowel wall thickening, distention, or inflammatory changes. Vascular/Lymphatic: Aortic  atherosclerosis. No enlarged abdominal or pelvic lymph nodes. Reproductive: Uterus and bilateral adnexa are unremarkable. Other: No ascites, focal collection or pneumoperitoneum. Musculoskeletal: No acute or suspicious bony abnormalities. IMPRESSION: 1. Circumferential wall thickening of the bladder with adjacent stranding/inflammation likely representing infection. Equivocal wall thickening of the  ureters likely representing infection/UTI as well. No evidence of hydronephrosis, urinary gas or urinary calculi. 2. Aortic Atherosclerosis (ICD10-I70.0). Electronically Signed   By: Harmon Pier M.D.   On: 11/29/2020 14:18        Scheduled Meds: . atorvastatin  40 mg Oral QHS  . docusate sodium  100 mg Oral BID  . enoxaparin (LOVENOX) injection  40 mg Subcutaneous Q24H  . fluconazole  200 mg Oral Daily  . insulin aspart  0-9 Units Subcutaneous TID WC  . nystatin ointment   Topical BID  . sodium chloride flush  3 mL Intravenous Q12H   Continuous Infusions: . sodium chloride 75 mL/hr at 12/01/20 0544  . cefTRIAXone (ROCEPHIN)  IV Stopped (11/30/20 2232)     LOS: 2 days    Time spent: 30 mins     Charise Killian, MD [facility] Pager [phone]  If 7PM-7AM, please contact night-coverage 12/01/2020, 7:55 AM

## 2020-12-01 NOTE — Care Management Important Message (Signed)
Important Message  Patient Details  Name: Desiree Espinoza MRN: [id number] Date of Birth: 01/23/1956   Medicare Important Message Given:  N/A - LOS <3 / Initial given by admissions  Initial Medicare IM reviewed with daughter, Margarita Sermons at [phone] by Jennye Moccasin, Patient Access Associate on 11/30/2020 at 11:50am.   Johnell Comings 12/01/2020, 8:15 AM

## 2020-12-01 NOTE — TOC Initial Note (Addendum)
Transition of Care [facility]) - Initial/Assessment Note    Patient Details  Name: Desiree Espinoza MRN: [id number] Date of Birth: 1956/10/08  Transition of Care [facility]) CM/SW Contact:    Desiree Espinoza Phone Number: 12/01/2020, 11:43 AM  Clinical Narrative:  CSW met with patient. Daughter Desiree Espinoza at bedside. CSW introduced role and explained that discharge planning would be discussed. Patient and daughter confirmed plan for long-term SNF placement. Patient does have Medicaid. CSW asked about facility preference. Daughter lives in the [facility] area and said there is a facility nearby that she is interested in. She is going to go by there today to inquire about availability and will provide CSW with their information if so. No further concerns. CSW encouraged patient and her daughter to contact CSW as needed. CSW will continue to follow patient and her daughter for support and facilitate discharge to LTC SNF once medically stable.               3:37 pm: Daughter asked about [facility] in [facility] but was concerned about their Medicare scores. She review CMS list and asked CSW to follow up with [facility], [facility], [facility], and [facility] at [place]. [facility] and [facility] both said you have to live in their ALF/ILF community to get SNF placement there. [facility] does not have any LTC beds. Left voicemail for admissions coordinators at [facility].  3:59 pm: [facility] in [facility] does not have any long-term beds  4:27 pm: Left voicemails at [facility], [place], [place] [place], [place], [place], and [facility]. Desiree Espinoza, and [facility] do not have long-term beds. Desiree Espinoza is on an admissions hold due to COVID and won't have a bed for at least a couple of weeks.   Expected Discharge Plan: Skilled Nursing Facility Barriers to Discharge:  Continued Medical Work up   Patient Goals and CMS Choice        Expected Discharge Plan and Services Expected Discharge Plan: [place] Acute Care Choice: [place] arrangements for the past 2 months: Single Family Home                                      Prior Living Arrangements/Services Living arrangements for the past 2 months: Single Family Home Lives with:: Spouse Patient language and need for interpreter reviewed:: Yes Do you feel safe going back to the place where you live?: No      Need for Family Participation in Patient Care: Yes (Comment) Care giver support system in place?: Yes (comment)   Criminal Activity/Legal Involvement Pertinent to Current Situation/Hospitalization: No - Comment as needed  Activities of Daily Living Home Assistive Devices/Equipment: Wheelchair ADL Screening (condition at time of admission) Patient's cognitive ability adequate to safely complete daily activities?: Yes Is the patient deaf or have difficulty hearing?: No Does the patient have difficulty seeing, even when wearing glasses/contacts?: No Does the patient have difficulty concentrating, remembering, or making decisions?: No Patient able to express need for assistance with ADLs?: Yes Does the patient have difficulty dressing or bathing?: No Independently performs ADLs?: No Communication: Independent Dressing (OT): Needs assistance Is this a change from baseline?: Pre-admission baseline Grooming: Independent Feeding: Independent Bathing: Needs assistance Is this a change from baseline?: Pre-admission baseline Toileting: Needs  assistance Is this a change from baseline?: Pre-admission baseline In/Out Bed: Needs assistance Is this a change from baseline?: Pre-admission baseline Walks in Home: Needs assistance Is this a change from baseline?: Pre-admission baseline Does the patient have difficulty walking or climbing  stairs?: Yes Weakness of Legs: Both Weakness of Arms/Hands: None  Permission Sought/Granted Permission sought to share information with : Facility Contact Representative,Family Supports Permission granted to share information with : Yes, Verbal Permission Granted  Share Information with NAME: [place] granted to share info w AGENCY: SNF's  Permission granted to share info w Relationship: Daughter  Permission granted to share info w Contact Information: [phone]  Emotional Assessment Appearance:: Appears stated age Attitude/Demeanor/Rapport: Engaged,Gracious Affect (typically observed): Accepting,Appropriate,Calm,Pleasant Orientation: : Oriented to Self,Oriented to Place,Oriented to  Time,Oriented to Situation Alcohol / Substance Use: Not Applicable Psych Involvement: No (comment)  Admission diagnosis:  Rash [R21] Nausea [R11.0] UTI (urinary tract infection) [N39.0] Appetite loss [R63.0] Bacterial UTI [N39.0, A49.9] Yeast UTI [B37.49] Candidal skin infection [B37.2] Hypotension, unspecified hypotension type [I95.9] Urinary tract infection with hematuria, site unspecified [N39.0, R31.9] Patient Active Problem List   Diagnosis Date Noted  . Influenza 12/17/2018  . UTI (urinary tract infection) 10/28/2018  . 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 ([address]), [place] [address] [place]) [place] [address] Phone: [phone] Fax: [phone]     Social Determinants of Health (SDOH) Interventions    Readmission Risk Interventions No flowsheet data found.

## 2020-12-02 DIAGNOSIS — G35 Multiple sclerosis: Secondary | ICD-10-CM

## 2020-12-02 DIAGNOSIS — B379 Candidiasis, unspecified: Secondary | ICD-10-CM

## 2020-12-02 LAB — BASIC METABOLIC PANEL
Anion gap: 9 (ref 5–15)
BUN: <5 mg/dL — ABNORMAL LOW (ref 8–23)
CO2: 23 mmol/L (ref 22–32)
Calcium: 8.4 mg/dL — ABNORMAL LOW (ref 8.9–10.3)
Chloride: 106 mmol/L (ref 98–111)
Creatinine, Ser: 0.41 mg/dL — ABNORMAL LOW (ref 0.44–1.00)
GFR, Estimated: >60 mL/min (ref >60)
Glucose, Bld: 241 mg/dL — ABNORMAL HIGH (ref 70–99)
Potassium: 3.7 mmol/L (ref 3.5–5.1)
Sodium: 138 mmol/L (ref 135–145)

## 2020-12-02 LAB — MAGNESIUM: Magnesium: 1.9 mg/dL (ref 1.7–2.4)

## 2020-12-02 LAB — CBC
HCT: 38.7 % (ref 36.0–46.0)
Hemoglobin: 12.4 g/dL (ref 12.0–15.0)
MCH: 26.1 pg (ref 26.0–34.0)
MCHC: 32 g/dL (ref 30.0–36.0)
MCV: 81.3 fL (ref 80.0–100.0)
Platelets: 367 10*3/uL (ref 150–400)
RBC: 4.76 MIL/uL (ref 3.87–5.11)
RDW: 15.3 % (ref 11.5–15.5)
WBC: 7.4 10*3/uL (ref 4.0–10.5)
nRBC: 0 % (ref 0.0–0.2)

## 2020-12-02 LAB — GLUCOSE, CAPILLARY
Glucose-Capillary: 223 mg/dL — ABNORMAL HIGH (ref 70–99)
Glucose-Capillary: 254 mg/dL — ABNORMAL HIGH (ref 70–99)
Glucose-Capillary: 281 mg/dL — ABNORMAL HIGH (ref 70–99)

## 2020-12-02 MED ORDER — MORPHINE SULFATE (PF) 2 MG/ML IV SOLN
2.0000 mg | INTRAVENOUS | Status: DC | PRN
  Administered 2020-12-02: 2 mg via INTRAVENOUS
  Filled 2020-12-02 (×2): qty 1

## 2020-12-02 NOTE — Progress Notes (Signed)
PROGRESS NOTE    Desiree Espinoza  WUJ:[id number] DOB: 15-Mar-1956 DOA: 11/28/2020 PCP: Preston Fleeting, MD   Assessment & Plan:   Active Problems:   UTI (urinary tract infection)   Sepsis: w/ hypotension, tachycardia, elevated lactic acid & likely UTI &/or yeast infection. Resolved    Yeast infection: continue on fluconazole. Urine cx is growing candida, sens pending which was sent to [facility]  Hypokalemia: WNL today   Leukocytosis: resolved  Lactic acidosis: resolved  DM2: poorly controlled, 10.8. Continue on SSI w/ accuchecks   MS: not on any meds. Wheelchair bound. [name] wants the pt to go to a long term SNF placement and CM is helping with that   HLD: continue on statin   DVT prophylaxis: lovenox Code Status: full  Family Communication:  Disposition Plan: possible SNF   Level of care: Med-Surg    Status is: Inpatient  Remains inpatient appropriate because:Ongoing diagnostic testing needed not appropriate for outpatient work up, Unsafe d/c plan and IV treatments appropriate due to intensity of illness or inability to take PO, awaiting urine cx results to finalize & waiting on long term SNF placement    Dispo: The patient is from: Home              Anticipated d/c is to: SNF              Anticipated d/c date is: 2 days              Patient currently is not medically stable to d/c.   Difficult to place patient Yes          Consultants:      Procedures:    Antimicrobials:    Subjective: Pt c/o fatigue   Objective: Vitals:   12/01/20 1158 12/01/20 1922 12/02/20 0056 12/02/20 0506  BP: 131/78 134/82 (!) 103/57   Pulse: (!) 104 (!) 106 (!) 113 (!) 103  Resp: 16 18 20    Temp: 98.9 F (37.2 C) (!) 97.5 F (36.4 C) 97.8 F (36.6 C)   TempSrc: Oral Oral Oral   SpO2: 97% 97% 96% 96%  Weight:      Height:        Intake/Output Summary (Last 24 hours) at 12/02/2020 0755 Last data filed at 12/02/2020 0658 Gross per 24 hour   Intake 840 ml  Output 1500 ml  Net -660 ml   Filed Weights   11/28/20 2002  Weight: 63.5 kg    Examination:  General exam: Appears calm & comfortable  Respiratory system: clear breath sounds b/l. No wheezes, rales or rhonchi  Cardiovascular system: S1 & S2+. No rubs or clicks  Gastrointestinal system:  Abd is soft, NT,ND & normal bowel sounds  Central nervous system: Alert and oriented. Moves all 4 extremities   Psychiatry: Judgement and insight appear normal.  Mood and affect appropriate     Data Reviewed: I have personally reviewed following labs and imaging studies  CBC: Recent Labs  Lab 11/28/20 2005 11/29/20 0943 11/30/20 0405 12/01/20 0325 12/02/20 0600  WBC 8.6 9.7 10.6* 8.7 7.4  NEUTROABS 4.7 7.4  --   --   --   HGB 14.9 13.1 12.0 11.5* 12.4  HCT 46.2* 42.2 36.6 36.3 38.7  MCV 83.4 83.4 82.4 83.1 81.3  PLT 434* 377 339 338 367   Basic Metabolic Panel: Recent Labs  Lab 11/29/20 0943 11/29/20 1826 11/30/20 0405 11/30/20 0823 12/01/20 0325 12/02/20 0600  NA 134* 141  --  138 141  138  K 4.3 3.6  --  3.5 3.3* 3.7  CL 97* 106  --  104 105 106  CO2 24 25  --  24 25 23   GLUCOSE 330* 190*  --  187* 176* 241*  BUN 8 <5*  --  <5* <5* <5*  CREATININE 0.37* 0.40*  --  0.46 0.38* 0.41*  CALCIUM 8.8* 8.5*  --  7.9* 7.6* 8.4*  MG  --   --  1.5*  --  1.9 1.9   GFR: Estimated Creatinine Clearance: 65.6 mL/min (A) (by C-G formula based on SCr of 0.41 mg/dL (L)). Liver Function Tests: Recent Labs  Lab 11/28/20 2005 11/29/20 0943 11/29/20 1826  AST 20 20 21   ALT 20 21 17   ALKPHOS 94 93 77  BILITOT 0.8 1.1 0.8  PROT 8.3* 7.6 7.2  ALBUMIN 3.7 3.5 3.1*   No results for input(s): LIPASE, AMYLASE in the last 168 hours. No results for input(s): AMMONIA in the last 168 hours. Coagulation Profile: No results for input(s): INR, PROTIME in the last 168 hours. Cardiac Enzymes: No results for input(s): CKTOTAL, CKMB, CKMBINDEX, TROPONINI in the last 168  hours. BNP (last 3 results) No results for input(s): PROBNP in the last 8760 hours. HbA1C: Recent Labs    11/30/20 0405  HGBA1C 10.8*   CBG: Recent Labs  Lab 12/01/20 0816 12/01/20 1149 12/01/20 1703 12/01/20 2103 12/02/20 0738  GLUCAP 168* 269* 204* 239* 223*   Lipid Profile: No results for input(s): CHOL, HDL, LDLCALC, TRIG, CHOLHDL, LDLDIRECT in the last 72 hours. Thyroid Function Tests: No results for input(s): TSH, T4TOTAL, FREET4, T3FREE, THYROIDAB in the last 72 hours. Anemia Panel: No results for input(s): VITAMINB12, FOLATE, FERRITIN, TIBC, IRON, RETICCTPCT in the last 72 hours. Sepsis Labs: Recent Labs  Lab 11/29/20 1323 11/29/20 1421 11/29/20 1826 11/30/20 0823  LATICACIDVEN 2.8* 2.7* 2.6* 1.1    Recent Results (from the past 240 hour(s))  SARS CORONAVIRUS 2 (TAT 6-24 HRS) Nasopharyngeal Nasopharyngeal Swab     Status: None   Collection Time: 11/29/20  6:02 AM   Specimen: Nasopharyngeal Swab  Result Value Ref Range Status   SARS Coronavirus 2 NEGATIVE NEGATIVE Final    Comment: (NOTE) SARS-CoV-2 target nucleic acids are NOT DETECTED.  The SARS-CoV-2 RNA is generally detectable in upper and lower respiratory specimens during the acute phase of infection. Negative results do not preclude SARS-CoV-2 infection, do not rule out co-infections with other pathogens, and should not be used as the sole basis for treatment or other patient management decisions. Negative results must be combined with clinical observations, patient history, and epidemiological information. The expected result is Negative.  Fact Sheet for Patients: 12/01/20  Fact Sheet for Healthcare Providers: 12/02/20  This test is not yet approved or cleared by the 12/01/20 FDA and  has been authorized for detection and/or diagnosis of SARS-CoV-2 by FDA under an Emergency Use Authorization (EUA). This EUA will remain   in effect (meaning this test can be used) for the duration of the COVID-19 declaration under Se ction 564(b)(1) of the Act, 21 U.S.C. section 360bbb-3(b)(1), unless the authorization is terminated or revoked sooner.  Performed at [facility], [address], [place], [facility] [address]   Urine culture     Status: Abnormal (Preliminary result)   Collection Time: 11/29/20 10:05 AM   Specimen: Urine, Random  Result Value Ref Range Status   Specimen Description   Final    URINE, RANDOM Performed at [facility]  Lab, [address] [place], [place] [address]    Special Requests   Final    NONE Performed at [facility], [address] [place], [place] [address]    Culture (A)  Final    70,000 COLONIES/mL CANDIDA ALBICANS Sent to [facility] for further susceptibility testing. Performed at [facility], [address] [place], [place] [address]    Report Status PENDING  Incomplete  Culture, blood (routine x 2)     Status: None (Preliminary result)   Collection Time: 11/29/20 10:53 AM   Specimen: BLOOD  Result Value Ref Range Status   Specimen Description BLOOD LEFT ANTECUBITAL  Final   Special Requests   Final    BOTTLES DRAWN AEROBIC AND ANAEROBIC Blood Culture results may not be optimal due to an excessive volume of blood received in culture bottles   Culture   Final    NO GROWTH 2 DAYS Performed at [facility], [address], [place], [place] [address]    Report Status PENDING  Incomplete  Culture, blood (routine x 2)     Status: None (Preliminary result)   Collection Time: 11/29/20  2:21 PM   Specimen: BLOOD  Result Value Ref Range Status   Specimen Description BLOOD LEFT ANTECUBITAL  Final   Special Requests   Final    BOTTLES DRAWN AEROBIC AND ANAEROBIC Blood Culture adequate volume   Culture   Final    NO GROWTH 2 DAYS Performed at [facility], [address] [place], [place] [address]    Report Status PENDING   Incomplete         Radiology Studies: No results found.      Scheduled Meds: . atorvastatin  40 mg Oral QHS  . docusate sodium  100 mg Oral BID  . enoxaparin (LOVENOX) injection  40 mg Subcutaneous Q24H  . fluconazole  200 mg Oral Daily  . insulin aspart  0-9 Units Subcutaneous TID WC  . multivitamin with minerals  1 tablet Oral Daily  . nystatin ointment   Topical BID  . Ensure Max Protein  11 oz Oral BID  . sodium chloride flush  3 mL Intravenous Q12H   Continuous Infusions:    LOS: 3 days    Time spent: 31 mins     Charise Killian, MD [facility] Pager [phone]  If 7PM-7AM, please contact night-coverage 12/02/2020, 7:55 AM

## 2020-12-02 NOTE — TOC Progression Note (Addendum)
Transition of Care [facility]) - Progression Note    Patient Details  Name: Desiree Espinoza MRN: [id number] Date of Birth: 29-Jun-1956  Transition of Care [facility]) CM/SW Contact  Bing Quarry, RN Phone Number: 12/02/2020, 9:01 AM  Clinical Narrative:   12/02/20 0900 Per CM notes of 12/8, search for SNF and LTACH, none currently available as of 1/28, phone calls pending to multitude of others. See CM note of 1/28. Provider note indicates approximate DC for medical stability was 2 days on 1/28.  Gabriel Cirri RN CM    Expected Discharge Plan: Skilled Nursing Facility Barriers to Discharge: Continued Medical Work up  Expected Discharge Plan and Services Expected Discharge Plan: Skilled Nursing Facility     Post Acute Care Choice: Skilled Nursing Facility Living arrangements for the past 2 months: Single Family Home                                       Social Determinants of Health (SDOH) Interventions    Readmission Risk Interventions No flowsheet data found.

## 2020-12-03 LAB — GLUCOSE, CAPILLARY
Glucose-Capillary: 240 mg/dL — ABNORMAL HIGH (ref 70–99)
Glucose-Capillary: 279 mg/dL — ABNORMAL HIGH (ref 70–99)
Glucose-Capillary: 211 mg/dL — ABNORMAL HIGH (ref 70–99)
Glucose-Capillary: 198 mg/dL — ABNORMAL HIGH (ref 70–99)

## 2020-12-03 LAB — BASIC METABOLIC PANEL
Anion gap: 8 (ref 5–15)
BUN: 8 mg/dL (ref 8–23)
CO2: 24 mmol/L (ref 22–32)
Calcium: 8.7 mg/dL — ABNORMAL LOW (ref 8.9–10.3)
Chloride: 102 mmol/L (ref 98–111)
Creatinine, Ser: 0.43 mg/dL — ABNORMAL LOW (ref 0.44–1.00)
GFR, Estimated: >60 mL/min (ref >60)
Glucose, Bld: 277 mg/dL — ABNORMAL HIGH (ref 70–99)
Potassium: 3.6 mmol/L (ref 3.5–5.1)
Sodium: 134 mmol/L — ABNORMAL LOW (ref 135–145)

## 2020-12-03 LAB — CBC
HCT: 38.6 % (ref 36.0–46.0)
Hemoglobin: 12.3 g/dL (ref 12.0–15.0)
MCH: 26.5 pg (ref 26.0–34.0)
MCHC: 31.9 g/dL (ref 30.0–36.0)
MCV: 83.2 fL (ref 80.0–100.0)
Platelets: 376 10*3/uL (ref 150–400)
RBC: 4.64 MIL/uL (ref 3.87–5.11)
RDW: 15.3 % (ref 11.5–15.5)
WBC: 7.7 10*3/uL (ref 4.0–10.5)
nRBC: 0 % (ref 0.0–0.2)

## 2020-12-03 LAB — MAGNESIUM: Magnesium: 1.7 mg/dL (ref 1.7–2.4)

## 2020-12-03 NOTE — Progress Notes (Signed)
PROGRESS NOTE    Desiree Espinoza  FAO:[id number] DOB: 1956-02-16 DOA: 11/28/2020 PCP: Preston Fleeting, MD   Assessment & Plan:   Active Problems:   UTI (urinary tract infection)   Sepsis: w/ hypotension, tachycardia, elevated lactic acid & likely UTI &/or yeast infection. Resolved    Yeast infection: continue on fluconazole. Urine cx is growing candida, sens pending which was sent to [facility]  Hypokalemia: within normal limits   Leukocytosis: resolved   Lactic acidosis: resolved   DM2: HbA1c 10.8, poorly controlled. Continue on SSI w/ accuchecks   MS: not on any meds. Wheelchair bound. [name] wants the pt to go to a long term SNF placement and CM is helping with that. No SNF/LTAC has been found for pt yet   HLD: continue on statin   DVT prophylaxis: lovenox Code Status: full  Family Communication:  Disposition Plan: possible SNF   Level of care: Med-Surg    Status is: Inpatient  Remains inpatient appropriate because: unsafe d/c plan. Awaiting urine cx results to finalize & waiting on long term SNF placement    Dispo: The patient is from: Home              Anticipated d/c is to: SNF              Anticipated d/c date is: whenever SNF/LTAC bed is available               Patient currently  Is medically stable for d/c   Difficult to place patient Yes    Consultants:      Procedures:    Antimicrobials:    Subjective: Pt c/o malaise   Objective: Vitals:   12/02/20 1644 12/02/20 1645 12/02/20 2356 12/03/20 0415  BP: (!) 139/107 130/81 106/77 101/71  Pulse: (!) 109 (!) 107 (!) 114 (!) 117  Resp: 18 18 20 20   Temp: 98.5 F (36.9 C) 98.3 F (36.8 C) (!) 97.5 F (36.4 C) 97.7 F (36.5 C)  TempSrc: Oral Oral Oral Oral  SpO2: 98% 99% 95% 97%  Weight:      Height:        Intake/Output Summary (Last 24 hours) at 12/03/2020 0730 Last data filed at 12/03/2020 12/05/2020 Gross per 24 hour  Intake 805 ml  Output 600 ml  Net 205 ml   Filed  Weights   11/28/20 2002  Weight: 63.5 kg    Examination:  General exam: Appears comfortable  Respiratory system: clear breath sounds b/l  Cardiovascular system: S1/S2+. No gallops or clicks   Gastrointestinal system:  Abd is soft, NT, ND & hypoactive bowel sounds Central nervous system: Alert and oriented. Moves all 4 extremities   Psychiatry: Judgement and insight appear normal.  Mood and affect appropriate     Data Reviewed: I have personally reviewed following labs and imaging studies  CBC: Recent Labs  Lab 11/28/20 2005 11/29/20 0943 11/30/20 0405 12/01/20 0325 12/02/20 0600 12/03/20 0618  WBC 8.6 9.7 10.6* 8.7 7.4 7.7  NEUTROABS 4.7 7.4  --   --   --   --   HGB 14.9 13.1 12.0 11.5* 12.4 12.3  HCT 46.2* 42.2 36.6 36.3 38.7 38.6  MCV 83.4 83.4 82.4 83.1 81.3 83.2  PLT 434* 377 339 338 367 376   Basic Metabolic Panel: Recent Labs  Lab 11/29/20 1826 11/30/20 0405 11/30/20 0823 12/01/20 0325 12/02/20 0600 12/03/20 0618  NA 141  --  138 141 138 134*  K 3.6  --  3.5  3.3* 3.7 3.6  CL 106  --  104 105 106 102  CO2 25  --  24 25 23 24   GLUCOSE 190*  --  187* 176* 241* 277*  BUN <5*  --  <5* <5* <5* 8  CREATININE 0.40*  --  0.46 0.38* 0.41* 0.43*  CALCIUM 8.5*  --  7.9* 7.6* 8.4* 8.7*  MG  --  1.5*  --  1.9 1.9 1.7   GFR: Estimated Creatinine Clearance: 65.6 mL/min (A) (by C-G formula based on SCr of 0.43 mg/dL (L)). Liver Function Tests: Recent Labs  Lab 11/28/20 2005 11/29/20 0943 11/29/20 1826  AST 20 20 21   ALT 20 21 17   ALKPHOS 94 93 77  BILITOT 0.8 1.1 0.8  PROT 8.3* 7.6 7.2  ALBUMIN 3.7 3.5 3.1*   No results for input(s): LIPASE, AMYLASE in the last 168 hours. No results for input(s): AMMONIA in the last 168 hours. Coagulation Profile: No results for input(s): INR, PROTIME in the last 168 hours. Cardiac Enzymes: No results for input(s): CKTOTAL, CKMB, CKMBINDEX, TROPONINI in the last 168 hours. BNP (last 3 results) No results for input(s):  PROBNP in the last 8760 hours. HbA1C: No results for input(s): HGBA1C in the last 72 hours. CBG: Recent Labs  Lab 12/01/20 1703 12/01/20 2103 12/02/20 0738 12/02/20 1154 12/02/20 1640  GLUCAP 204* 239* 223* 254* 281*   Lipid Profile: No results for input(s): CHOL, HDL, LDLCALC, TRIG, CHOLHDL, LDLDIRECT in the last 72 hours. Thyroid Function Tests: No results for input(s): TSH, T4TOTAL, FREET4, T3FREE, THYROIDAB in the last 72 hours. Anemia Panel: No results for input(s): VITAMINB12, FOLATE, FERRITIN, TIBC, IRON, RETICCTPCT in the last 72 hours. Sepsis Labs: Recent Labs  Lab 11/29/20 1323 11/29/20 1421 11/29/20 1826 11/30/20 0823  LATICACIDVEN 2.8* 2.7* 2.6* 1.1    Recent Results (from the past 240 hour(s))  SARS CORONAVIRUS 2 (TAT 6-24 HRS) Nasopharyngeal Nasopharyngeal Swab     Status: None   Collection Time: 11/29/20  6:02 AM   Specimen: Nasopharyngeal Swab  Result Value Ref Range Status   SARS Coronavirus 2 NEGATIVE NEGATIVE Final    Comment: (NOTE) SARS-CoV-2 target nucleic acids are NOT DETECTED.  The SARS-CoV-2 RNA is generally detectable in upper and lower respiratory specimens during the acute phase of infection. Negative results do not preclude SARS-CoV-2 infection, do not rule out co-infections with other pathogens, and should not be used as the sole basis for treatment or other patient management decisions. Negative results must be combined with clinical observations, patient history, and epidemiological information. The expected result is Negative.  Fact Sheet for Patients: 12/01/20  Fact Sheet for Healthcare Providers: 12/02/20  This test is not yet approved or cleared by the 12/01/20 FDA and  has been authorized for detection and/or diagnosis of SARS-CoV-2 by FDA under an Emergency Use Authorization (EUA). This EUA will remain  in effect (meaning this test can be used) for the  duration of the COVID-19 declaration under Se ction 564(b)(1) of the Act, 21 U.S.C. section 360bbb-3(b)(1), unless the authorization is terminated or revoked sooner.  Performed at [facility], [address], [place], [facility] [address]   Urine culture     Status: Abnormal (Preliminary result)   Collection Time: 11/29/20 10:05 AM   Specimen: Urine, Random  Result Value Ref Range Status   Specimen Description   Final    URINE, RANDOM Performed at [facility], [address], [place], [facility] [address]    Special  Requests   Final    NONE Performed at [facility], [address] [place], [place] [address]    Culture (A)  Final    70,000 COLONIES/mL CANDIDA ALBICANS Sent to [facility] for further susceptibility testing. Performed at [facility], [address] [place], [place] [address]    Report Status PENDING  Incomplete  Culture, blood (routine x 2)     Status: None (Preliminary result)   Collection Time: 11/29/20 10:53 AM   Specimen: BLOOD  Result Value Ref Range Status   Specimen Description BLOOD LEFT ANTECUBITAL  Final   Special Requests   Final    BOTTLES DRAWN AEROBIC AND ANAEROBIC Blood Culture results may not be optimal due to an excessive volume of blood received in culture bottles   Culture   Final    NO GROWTH 3 DAYS Performed at [facility], [address], [place], [place] [address]    Report Status PENDING  Incomplete  Culture, blood (routine x 2)     Status: None (Preliminary result)   Collection Time: 11/29/20  2:21 PM   Specimen: BLOOD  Result Value Ref Range Status   Specimen Description BLOOD LEFT ANTECUBITAL  Final   Special Requests   Final    BOTTLES DRAWN AEROBIC AND ANAEROBIC Blood Culture adequate volume   Culture   Final    NO GROWTH 3 DAYS Performed at [facility], [address], [place], [place] [address]    Report Status PENDING  Incomplete         Radiology Studies: No  results found.      Scheduled Meds: . atorvastatin  40 mg Oral QHS  . docusate sodium  100 mg Oral BID  . enoxaparin (LOVENOX) injection  40 mg Subcutaneous Q24H  . fluconazole  200 mg Oral Daily  . insulin aspart  0-9 Units Subcutaneous TID WC  . multivitamin with minerals  1 tablet Oral Daily  . nystatin ointment   Topical BID  . Ensure Max Protein  11 oz Oral BID  . sodium chloride flush  3 mL Intravenous Q12H   Continuous Infusions:    LOS: 4 days    Time spent: 30 mins     Charise Killian, MD [facility] Pager [phone]  If 7PM-7AM, please contact night-coverage 12/03/2020, 7:30 AM

## 2020-12-03 NOTE — Plan of Care (Addendum)
Pt Axox4. Calm and cooperative and able to voice her needs. Prn pain med administered once for c/o of leg pain 7/10 increased pain with repositioning. Pt needs set up for meals and able to feed self and take pills whole. Fungal rash to peri/buttocks, groin area noted with Nystatin ointment applied and patient q2 hours turn. Pt incontinent of bladder and bowel. On RA, Safety measures in place. Will continue to monitor.   Problem: Education: Goal: Knowledge of General Education information will improve Description: Including pain rating scale, medication(s)/side effects and non-pharmacologic comfort measures Outcome: Progressing   Problem: Health Behavior/Discharge Planning: Goal: Ability to manage health-related needs will improve Outcome: Progressing   Problem: Clinical Measurements: Goal: Ability to maintain clinical measurements within normal limits will improve Outcome: Progressing Goal: Will remain free from infection Outcome: Progressing Goal: Diagnostic test results will improve Outcome: Progressing Goal: Respiratory complications will improve Outcome: Progressing Goal: Cardiovascular complication will be avoided Outcome: Progressing   Problem: Activity: Goal: Risk for activity intolerance will decrease Outcome: Progressing   Problem: Nutrition: Goal: Adequate nutrition will be maintained Outcome: Progressing   Problem: Coping: Goal: Level of anxiety will decrease Outcome: Progressing   Problem: Elimination: Goal: Will not experience complications related to bowel motility Outcome: Progressing Goal: Will not experience complications related to urinary retention Outcome: Progressing   Problem: Pain Managment: Goal: General experience of comfort will improve Outcome: Progressing   Problem: Safety: Goal: Ability to remain free from injury will improve Outcome: Progressing   Problem: Skin Integrity: Goal: Risk for impaired skin integrity will decrease Outcome:  Progressing

## 2020-12-03 NOTE — Plan of Care (Signed)

## 2020-12-04 LAB — BASIC METABOLIC PANEL
Anion gap: 11 (ref 5–15)
BUN: 6 mg/dL — ABNORMAL LOW (ref 8–23)
CO2: 26 mmol/L (ref 22–32)
Calcium: 8.8 mg/dL — ABNORMAL LOW (ref 8.9–10.3)
Chloride: 100 mmol/L (ref 98–111)
Creatinine, Ser: 0.45 mg/dL (ref 0.44–1.00)
GFR, Estimated: >60 mL/min (ref >60)
Glucose, Bld: 218 mg/dL — ABNORMAL HIGH (ref 70–99)
Potassium: 3.7 mmol/L (ref 3.5–5.1)
Sodium: 137 mmol/L (ref 135–145)

## 2020-12-04 LAB — CULTURE, BLOOD (ROUTINE X 2)
Culture: NO GROWTH
Culture: NO GROWTH
Special Requests: ADEQUATE

## 2020-12-04 LAB — GLUCOSE, CAPILLARY
Glucose-Capillary: 226 mg/dL — ABNORMAL HIGH (ref 70–99)
Glucose-Capillary: 296 mg/dL — ABNORMAL HIGH (ref 70–99)
Glucose-Capillary: 251 mg/dL — ABNORMAL HIGH (ref 70–99)
Glucose-Capillary: 353 mg/dL — ABNORMAL HIGH (ref 70–99)

## 2020-12-04 LAB — CBC
HCT: 39.5 % (ref 36.0–46.0)
Hemoglobin: 12.7 g/dL (ref 12.0–15.0)
MCH: 26.4 pg (ref 26.0–34.0)
MCHC: 32.2 g/dL (ref 30.0–36.0)
MCV: 82.1 fL (ref 80.0–100.0)
Platelets: 412 10*3/uL — ABNORMAL HIGH (ref 150–400)
RBC: 4.81 MIL/uL (ref 3.87–5.11)
RDW: 15.4 % (ref 11.5–15.5)
WBC: 8.1 10*3/uL (ref 4.0–10.5)
nRBC: 0 % (ref 0.0–0.2)

## 2020-12-04 LAB — MAGNESIUM: Magnesium: 1.6 mg/dL — ABNORMAL LOW (ref 1.7–2.4)

## 2020-12-04 MED ORDER — MAGNESIUM SULFATE 2 GM/50ML IV SOLN
2.0000 g | Freq: Once | INTRAVENOUS | Status: AC
  Administered 2020-12-04: 2 g via INTRAVENOUS
  Filled 2020-12-04: qty 50

## 2020-12-04 NOTE — NC FL2 (Signed)
[place] MEDICAID FL2 LEVEL OF CARE SCREENING TOOL     IDENTIFICATION  Patient Name: Desiree Espinoza Birthdate: 07-05-1956 Sex: female Admission Date (Current Location): 11/28/2020  [place] and [place] Number:  Chiropodist and Address:  [facility], [address], [place], [place] [address]      Provider Number: [id number]  Attending Physician Name and Address:  Charise Killian, MD  Relative Name and Phone Number:       Current Level of Care: Hospital Recommended Level of Care: Skilled Nursing Facility Prior Approval Number:    Date Approved/Denied:   PASRR Number: [id number]  Discharge Plan: SNF    Current Diagnoses: Patient Active Problem List   Diagnosis Date Noted  . Influenza 12/17/2018  . UTI (urinary tract infection) 10/28/2018  . 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    Orientation RESPIRATION BLADDER Height & Weight     Self,Time,Situation,Place  Normal Incontinent,External catheter Weight: 140 lb (63.5 kg) Height:  5\' 5"  (165.1 cm)  BEHAVIORAL SYMPTOMS/MOOD NEUROLOGICAL BOWEL NUTRITION STATUS   (None)  (None) Incontinent Diet (Carb modified)  AMBULATORY STATUS COMMUNICATION OF NEEDS Skin   Total Care Verbally Other (Comment) (Excoriated, rash.)                       Personal Care Assistance Level of Assistance              Functional Limitations Info  Sight,Hearing,Speech Sight Info: Adequate Hearing Info: Adequate Speech Info: Adequate    SPECIAL CARE FACTORS FREQUENCY                       Contractures Contractures Info: Not present    Additional Factors Info  Code Status,Allergies Code Status Info: Full code Allergies Info: NKDA           Current Medications (12/04/2020):  This is the current hospital active medication list Current Facility-Administered Medications  Medication Dose Route Frequency Provider Last Rate Last Admin  . acetaminophen (TYLENOL)  tablet 650 mg  650 mg Oral Q6H PRN 12/06/2020, MD   650 mg at 11/29/20 1806   Or  . acetaminophen (TYLENOL) suppository 650 mg  650 mg Rectal Q6H PRN 12/01/20, MD      . atorvastatin (LIPITOR) tablet 40 mg  40 mg Oral QHS Triplett, Cari B, FNP   40 mg at 12/03/20 2157  . bisacodyl (DULCOLAX) EC tablet 5 mg  5 mg Oral Daily PRN 2158, MD      . docusate sodium (COLACE) capsule 100 mg  100 mg Oral BID Triplett, Cari B, FNP   100 mg at 12/04/20 0809  . enoxaparin (LOVENOX) injection 40 mg  40 mg Subcutaneous Q24H 12/06/20, MD   40 mg at 12/03/20 2156  . fluconazole (DIFLUCAN) tablet 200 mg  200 mg Oral Daily 2157, MD   200 mg at 12/04/20 0809  . insulin aspart (novoLOG) injection 0-9 Units  0-9 Units Subcutaneous TID WC 12/06/20, MD   5 Units at 12/04/20 1221  . metoprolol tartrate (LOPRESSOR) injection 5 mg  5 mg Intravenous Q6H PRN 1222, MD      . morphine 2 MG/ML injection 2 mg  2 mg Intravenous Q4H PRN Mansy, Jan A, MD   2 mg at 12/02/20 2322  . multivitamin with minerals tablet 1 tablet  1 tablet Oral Daily 2323, MD  1 tablet at 12/04/20 0809  . nystatin ointment (MYCOSTATIN)   Topical BID Irean Hong, MD   1 application at 12/04/20 [phone]  . ondansetron (ZOFRAN) tablet 4 mg  4 mg Oral Q6H PRN Charise Killian, MD       Or  . ondansetron [facility]) injection 4 mg  4 mg Intravenous Q6H PRN Charise Killian, MD      . oxyCODONE (Oxy IR/ROXICODONE) immediate release tablet 5 mg  5 mg Oral Q4H PRN Charise Killian, MD   5 mg at 12/04/20 0631  . protein supplement (ENSURE MAX) liquid  11 oz Oral BID Charise Killian, MD   11 oz at 12/04/20 0809  . sodium chloride flush (NS) 0.9 % injection 3 mL  3 mL Intravenous Q12H Charise Killian, MD   3 mL at 12/04/20 1898     Discharge Medications: Please see discharge summary for a list of discharge medications.  Relevant Imaging  Results:  Relevant Lab Results:   Additional Information SS#: [id number]  Margarito Liner, LCSW

## 2020-12-04 NOTE — Progress Notes (Deleted)
OMIT  

## 2020-12-04 NOTE — Care Management Important Message (Signed)
Important Message  Patient Details  Name: Desiree Espinoza MRN: [id number] Date of Birth: Jan 05, 1956   Medicare Important Message Given:  Yes     Johnell Comings 12/04/2020, 1:18 PM

## 2020-12-04 NOTE — TOC Progression Note (Signed)
Transition of Care [facility]) - Progression Note    Patient Details  Name: Desiree Espinoza MRN: [id number] Date of Birth: 1956/09/18  Transition of Care [facility] CM/SW Contact  Margarito Liner, LCSW Phone Number: 12/04/2020, 4:35 PM  Clinical Narrative:   No call backs from the SNF's that CSW left voicemails for on Friday. Daughter interested in [facility] if [place]. Sent referral and asked admissions coordinator to review for long-term care.  Expected Discharge Plan: Skilled Nursing Facility Barriers to Discharge: Continued Medical Work up  Expected Discharge Plan and Services Expected Discharge Plan: Skilled Nursing Facility     Post Acute Care Choice: Skilled Nursing Facility Living arrangements for the past 2 months: Single Family Home                                       Social Determinants of Health (SDOH) Interventions    Readmission Risk Interventions No flowsheet data found.

## 2020-12-04 NOTE — Progress Notes (Signed)
Inpatient Diabetes Program Recommendations  AACE/ADA: New Consensus Statement on Inpatient Glycemic Control (2015)  Target Ranges:  Prepandial:   less than 140 mg/dL      [facility] postprandial:   less than 180 mg/dL (1-2 hours)      Critically ill patients:  140 - 180 mg/dL   Results for [name], [name] (MRN [id number]) as of 12/04/2020 08:18  Ref. Range 12/03/2020 07:57 12/03/2020 11:31 12/03/2020 17:00 12/03/2020 21:20  Glucose-Capillary Latest Ref Range: 70 - 99 mg/dL 208 (H) 022 (H) 336 (H) 198 (H)   Results for [name], [name] (MRN [id number]) as of 12/04/2020 08:18  Ref. Range 12/04/2020 07:34  Glucose-Capillary Latest Ref Range: 70 - 99 mg/dL 005 (H)     Home DM Meds: Metformin 1000 mg BID       Glipizide 10 mg BID  Current Orders: Novolog Sensitive Correction Scale/ SSI (0-9 units) TID AC    MD- While home oral meds are on hold, please consider adding basal insulin.  AM CBGs have been elevated the last 2 days.  226 this AM.  1. Consider adding Lantus 6 units Daily (0.1 units/kg)  2. Please also consider increasing the Novolog SSI to the Moderate scale (0-15 units)   --Will follow patient during hospitalization--  Ambrose Finland RN, MSN, CDE Diabetes Coordinator Inpatient Glycemic Control Team Team Pager: [phone] (8a-5p)

## 2020-12-04 NOTE — Progress Notes (Signed)
PROGRESS NOTE    Desiree Espinoza  AVW:[id number] DOB: [birth year] DOA: 11/28/2020 PCP: Preston Fleeting, MD   Assessment & Plan:   Active Problems:   UTI (urinary tract infection)   Sepsis: w/ hypotension, tachycardia, elevated lactic acid & likely UTI &/or yeast infection. Resolved    Yeast infection: continue on fluconazole. Urine cx is growing candida & [facility] will be completing sens   Thrombocytosis: etiology unclear, likely reactive. Will continue to monitor   Hypokalemia: WNL today   Leukocytosis: resolved   Lactic acidosis: resolved   DM2: poorly controlled w/ HbA1c 10.8. Continue on SSI w/ accuchecks.   MS: not on any meds. Wheelchair bound. [name] wants the pt to go to a long term SNF placement and CM is helping with that. No SNF/LATC has not been found yet   HLD: continue on statin   DVT prophylaxis: lovenox Code Status: full  Family Communication:  Disposition Plan: possible SNF   Level of care: Med-Surg    Status is: Inpatient  Remains inpatient appropriate because: unsafe d/c plan. Awaiting urine cx results to finalize & waiting on long term SNF placement    Dispo: The patient is from: Home              Anticipated d/c is to: SNF              Anticipated d/c date is: whenever SNF/LTAC bed is available               Patient currently  is medically stable for d/c   Difficult to place patient Yes    Consultants:      Procedures:    Antimicrobials:    Subjective: Pt c/o fatigue   Objective: Vitals:   12/03/20 1702 12/03/20 2036 12/04/20 0435 12/04/20 0455  BP: 112/60 118/70 119/81   Pulse: (!) 103 (!) 106 (!) 113 (!) 105  Resp: 16 16 18    Temp: 98.1 F (36.7 C) 97.9 F (36.6 C) 98 F (36.7 C)   TempSrc: Oral Oral Oral   SpO2: 96% 97% 96%   Weight:      Height:        Intake/Output Summary (Last 24 hours) at 12/04/2020 0727 Last data filed at 12/03/2020 1828 Gross per 24 hour  Intake 1080 ml  Output -  Net  1080 ml   Filed Weights   11/28/20 2002  Weight: 63.5 kg    Examination:  General exam: Appears calm & comfortable  Respiratory system: clear breath sounds b/l. No wheezes, rales, or rhonchi  Cardiovascular system: S1 & S2+. No clicks or rubs  Gastrointestinal system:  Abd is soft, NT, ND & hypoactive bowel sounds Central nervous system: Alert and oriented. Moves all 4 extremities   Psychiatry: Judgement and insight appear normal.  Mood and affect appropriate     Data Reviewed: I have personally reviewed following labs and imaging studies  CBC: Recent Labs  Lab 11/28/20 2005 11/29/20 0943 11/30/20 0405 12/01/20 0325 12/02/20 0600 12/03/20 0618 12/04/20 0531  WBC 8.6 9.7 10.6* 8.7 7.4 7.7 8.1  NEUTROABS 4.7 7.4  --   --   --   --   --   HGB 14.9 13.1 12.0 11.5* 12.4 12.3 12.7  HCT 46.2* 42.2 36.6 36.3 38.7 38.6 39.5  MCV 83.4 83.4 82.4 83.1 81.3 83.2 82.1  PLT 434* 377 339 338 367 376 412*   Basic Metabolic Panel: Recent Labs  Lab 11/30/20 0405 11/30/20 0823 12/01/20 0325 12/02/20  0600 12/03/20 0618 12/04/20 0531  NA  --  138 141 138 134* 137  K  --  3.5 3.3* 3.7 3.6 3.7  CL  --  104 105 106 102 100  CO2  --  24 25 23 24 26   GLUCOSE  --  187* 176* 241* 277* 218*  BUN  --  <5* <5* <5* 8 6*  CREATININE  --  0.46 0.38* 0.41* 0.43* 0.45  CALCIUM  --  7.9* 7.6* 8.4* 8.7* 8.8*  MG 1.5*  --  1.9 1.9 1.7 1.6*   GFR: Estimated Creatinine Clearance: 65.6 mL/min (by C-G formula based on SCr of 0.45 mg/dL). Liver Function Tests: Recent Labs  Lab 11/28/20 2005 11/29/20 0943 11/29/20 1826  AST 20 20 21   ALT 20 21 17   ALKPHOS 94 93 77  BILITOT 0.8 1.1 0.8  PROT 8.3* 7.6 7.2  ALBUMIN 3.7 3.5 3.1*   No results for input(s): LIPASE, AMYLASE in the last 168 hours. No results for input(s): AMMONIA in the last 168 hours. Coagulation Profile: No results for input(s): INR, PROTIME in the last 168 hours. Cardiac Enzymes: No results for input(s): CKTOTAL, CKMB,  CKMBINDEX, TROPONINI in the last 168 hours. BNP (last 3 results) No results for input(s): PROBNP in the last 8760 hours. HbA1C: No results for input(s): HGBA1C in the last 72 hours. CBG: Recent Labs  Lab 12/02/20 1640 12/03/20 0757 12/03/20 1131 12/03/20 1700 12/03/20 2120  GLUCAP 281* 240* 279* 211* 198*   Lipid Profile: No results for input(s): CHOL, HDL, LDLCALC, TRIG, CHOLHDL, LDLDIRECT in the last 72 hours. Thyroid Function Tests: No results for input(s): TSH, T4TOTAL, FREET4, T3FREE, THYROIDAB in the last 72 hours. Anemia Panel: No results for input(s): VITAMINB12, FOLATE, FERRITIN, TIBC, IRON, RETICCTPCT in the last 72 hours. Sepsis Labs: Recent Labs  Lab 11/29/20 1323 11/29/20 1421 11/29/20 1826 11/30/20 0823  LATICACIDVEN 2.8* 2.7* 2.6* 1.1    Recent Results (from the past 240 hour(s))  SARS CORONAVIRUS 2 (TAT 6-24 HRS) Nasopharyngeal Nasopharyngeal Swab     Status: None   Collection Time: 11/29/20  6:02 AM   Specimen: Nasopharyngeal Swab  Result Value Ref Range Status   SARS Coronavirus 2 NEGATIVE NEGATIVE Final    Comment: (NOTE) SARS-CoV-2 target nucleic acids are NOT DETECTED.  The SARS-CoV-2 RNA is generally detectable in upper and lower respiratory specimens during the acute phase of infection. Negative results do not preclude SARS-CoV-2 infection, do not rule out co-infections with other pathogens, and should not be used as the sole basis for treatment or other patient management decisions. Negative results must be combined with clinical observations, patient history, and epidemiological information. The expected result is Negative.  Fact Sheet for Patients: 12/01/20  Fact Sheet for Healthcare Providers: 12/02/20  This test is not yet approved or cleared by the 12/01/20 FDA and  has been authorized for detection and/or diagnosis of SARS-CoV-2 by FDA under an Emergency Use  Authorization (EUA). This EUA will remain  in effect (meaning this test can be used) for the duration of the COVID-19 declaration under Se ction 564(b)(1) of the Act, 21 U.S.C. section 360bbb-3(b)(1), unless the authorization is terminated or revoked sooner.  Performed at [facility], [address] [place], [facility] [address]   Urine culture     Status: Abnormal (Preliminary result)   Collection Time: 11/29/20 10:05 AM   Specimen: Urine, Random  Result Value Ref Range Status   Specimen Description   Final  URINE, RANDOM Performed at [facility], [address] [place], [place] [address]    Special Requests   Final    NONE Performed at [facility], [address] [place], [place] [address]    Culture (A)  Final    70,000 COLONIES/mL CANDIDA ALBICANS Sent to [facility] for further susceptibility testing. Performed at [facility], [address], [place], [place] [address]    Report Status PENDING  Incomplete  Culture, blood (routine x 2)     Status: None (Preliminary result)   Collection Time: 11/29/20 10:53 AM   Specimen: BLOOD  Result Value Ref Range Status   Specimen Description BLOOD LEFT ANTECUBITAL  Final   Special Requests   Final    BOTTLES DRAWN AEROBIC AND ANAEROBIC Blood Culture results may not be optimal due to an excessive volume of blood received in culture bottles   Culture   Final    NO GROWTH 4 DAYS Performed at [facility], [address] [place], [place] [address]    Report Status PENDING  Incomplete  Culture, blood (routine x 2)     Status: None (Preliminary result)   Collection Time: 11/29/20  2:21 PM   Specimen: BLOOD  Result Value Ref Range Status   Specimen Description BLOOD LEFT ANTECUBITAL  Final   Special Requests   Final    BOTTLES DRAWN AEROBIC AND ANAEROBIC Blood Culture adequate volume   Culture   Final    NO GROWTH 4 DAYS Performed at [facility], [address]  [place], [place] [address]    Report Status PENDING  Incomplete         Radiology Studies: No results found.      Scheduled Meds: . atorvastatin  40 mg Oral QHS  . docusate sodium  100 mg Oral BID  . enoxaparin (LOVENOX) injection  40 mg Subcutaneous Q24H  . fluconazole  200 mg Oral Daily  . insulin aspart  0-9 Units Subcutaneous TID WC  . multivitamin with minerals  1 tablet Oral Daily  . nystatin ointment   Topical BID  . Ensure Max Protein  11 oz Oral BID  . sodium chloride flush  3 mL Intravenous Q12H   Continuous Infusions: . magnesium sulfate bolus IVPB       LOS: 5 days    Time spent: 31 mins     Charise Killian, MD [facility] Pager [phone]  If 7PM-7AM, please contact night-coverage 12/04/2020, 7:27 AM

## 2020-12-05 LAB — BASIC METABOLIC PANEL
Anion gap: 11 (ref 5–15)
BUN: 5 mg/dL — ABNORMAL LOW (ref 8–23)
CO2: 26 mmol/L (ref 22–32)
Calcium: 8.7 mg/dL — ABNORMAL LOW (ref 8.9–10.3)
Chloride: 100 mmol/L (ref 98–111)
Creatinine, Ser: 0.46 mg/dL (ref 0.44–1.00)
GFR, Estimated: >60 mL/min (ref >60)
Glucose, Bld: 247 mg/dL — ABNORMAL HIGH (ref 70–99)
Potassium: 4.1 mmol/L (ref 3.5–5.1)
Sodium: 137 mmol/L (ref 135–145)

## 2020-12-05 LAB — MAGNESIUM: Magnesium: 2 mg/dL (ref 1.7–2.4)

## 2020-12-05 LAB — CBC
HCT: 40.6 % (ref 36.0–46.0)
Hemoglobin: 13 g/dL (ref 12.0–15.0)
MCH: 26.3 pg (ref 26.0–34.0)
MCHC: 32 g/dL (ref 30.0–36.0)
MCV: 82.2 fL (ref 80.0–100.0)
Platelets: 444 10*3/uL — ABNORMAL HIGH (ref 150–400)
RBC: 4.94 MIL/uL (ref 3.87–5.11)
RDW: 15.6 % — ABNORMAL HIGH (ref 11.5–15.5)
WBC: 8.6 10*3/uL (ref 4.0–10.5)
nRBC: 0 % (ref 0.0–0.2)

## 2020-12-05 LAB — GLUCOSE, CAPILLARY
Glucose-Capillary: 252 mg/dL — ABNORMAL HIGH (ref 70–99)
Glucose-Capillary: 292 mg/dL — ABNORMAL HIGH (ref 70–99)
Glucose-Capillary: 269 mg/dL — ABNORMAL HIGH (ref 70–99)
Glucose-Capillary: 218 mg/dL — ABNORMAL HIGH (ref 70–99)

## 2020-12-05 LAB — MISC LABCORP TEST (SEND OUT): Labcorp test code: 183119

## 2020-12-05 NOTE — TOC Progression Note (Signed)
Transition of Care [facility]) - Progression Note    Patient Details  Name: Aizlyn Schifano MRN: [id number] Date of Birth: 10/17/1956  Transition of [facility]) CM/SW Contact  Chapman Fitch, RN Phone Number: 12/05/2020, 2:20 PM  Clinical Narrative:     [facility] does not have a long ter bed available.  Spoke with daughter and husband.  They are not interested in further extending bed search  They are both in agreement for patient to return home with home health services.  They request.  [facility] home health.  Referral made to [place] with [facility].   Order for hospital bed and lift placed.  Family in agreement.  Referral made to [facility] with [facility].    Currently, [facility], [facility] and [facility] are out of lifts.   [facility] to provide hospital bed prior to discharge, and will deliver lift when in stock  Expected Discharge Plan: Skilled Nursing Facility  Barriers to Discharge: Continued Medical Work up  Expected Discharge Plan and Services Expected Discharge Plan: Skilled Nursing Facility     Post Acute Care Choice: Skilled Nursing Facility Living arrangements for the past 2 months: Single Family Home                                       Social Determinants of Health (SDOH) Interventions    Readmission Risk Interventions No flowsheet data found.

## 2020-12-05 NOTE — Progress Notes (Addendum)
PROGRESS NOTE   65 y/o F w/ PMH of multiple sclerosis, DM2, HLD who presents w/ urinary frequency x 2 days. Pt denies any dysuria, urinary urgency. Pt was recently treated for UTI & yeast infection but pt does not remember what she took. Pt denies any fever, chills, sweating, cough, chest pain, shortness of breath, nausea, vomiting, abd pain, diarrhea, or constipation. Of note pt is wheelchair bound and does not ambulate. Pt lives at home w/ her husband.   Pt was found to have urine cx is growing candida and [facility] will be completing sens. Pt has been on fluconazole. Of note, pt's daughter wants to get the pt to LTAC as pt's husband is handicapped as well and no longer able to take care of the pt. CM and pt's daughter are working on trying to place the pt still, no bed offers yet. Now pt's daughter is going to take the pt home w/ home health, hospital bed. Will likely d/c tomorrow w/ home health once all equipment is delivered     Desiree Espinoza  ZOX:[id number] DOB: Mar 05, 1956 DOA: 11/28/2020 PCP: Preston Fleeting, MD   Assessment & Plan:   Active Problems:   UTI (urinary tract infection)   Sepsis: w/ hypotension, tachycardia, elevated lactic acid & likely UTI &/or yeast infection. Resolved    Yeast infection: continue on fluconazole. Urine cx is growing candida & [facility] will be completing sens   Thrombocytosis: etiology unclear, likely reactive. Will continue to monitor   Hypokalemia: WNL   Leukocytosis: resolved   Lactic acidosis: resolved   DM2: poorly controlled w/ HbA1c 10.8 . Continue on SSI w/ accuchecks   MS: not on any meds. Wheelchair bound. [name] wants the pt to go to a long term SNF placement and CM is helping with that. No SNF/LATC has not been found yet. Now pt's daughter is going to take the pt home w/ home health, hospital bed. Will likely d/c tomorrow w/ home health once all equipment is delivered   HLD: continue on statin   DVT prophylaxis:  lovenox Code Status: full  Family Communication:  Disposition Plan: possible home w/ home health   Level of care: Med-Surg    Status is: Inpatient  Remains inpatient appropriate because: unsafe d/c plan. Awaiting urine cx results to finalize & waiting on LTAC   Dispo: The patient is from: Home              Anticipated d/c is to: SNF              Anticipated d/c date is: whenever LTAC bed is available               Patient currently  is medically stable for d/c   Difficult to place patient Yes    Consultants:      Procedures:    Antimicrobials:    Subjective: Pt c/o malaise. Pt denies any complaints   Objective: Vitals:   12/04/20 1108 12/04/20 1936 12/04/20 2305 12/05/20 0645  BP: 111/72 108/72 109/65 101/68  Pulse: (!) 106 (!) 117 (!) 117 (!) 116  Resp: 18 14 16 16   Temp: 97.7 F (36.5 C) 98.5 F (36.9 C) 98.4 F (36.9 C) 98.4 F (36.9 C)  TempSrc: Oral Oral Oral Oral  SpO2: 96% 96% 97% 95%  Weight:      Height:        Intake/Output Summary (Last 24 hours) at 12/05/2020 0757 Last data filed at 12/05/2020 0548 Gross per  24 hour  Intake 720 ml  Output 900 ml  Net -180 ml   Filed Weights   11/28/20 2002  Weight: 63.5 kg    Examination:  General exam: Appears comfortable  Respiratory system: clear breath sounds b/l  Cardiovascular system: S1/S2+. No rubs or gallops or clicks   Gastrointestinal system:  Abd is soft, NT, ND & hypoactive bowel sounds Central nervous system: Alert and oriented. Moves all 4 extremities  Psychiatry: Judgement and insight appear normal.  Flat mood and affect      Data Reviewed: I have personally reviewed following labs and imaging studies  CBC: Recent Labs  Lab 11/28/20 2005 11/29/20 0943 11/30/20 0405 12/01/20 0325 12/02/20 0600 12/03/20 0618 12/04/20 0531 12/05/20 0345  WBC 8.6 9.7   < > 8.7 7.4 7.7 8.1 8.6  NEUTROABS 4.7 7.4  --   --   --   --   --   --   HGB 14.9 13.1   < > 11.5* 12.4 12.3 12.7 13.0   HCT 46.2* 42.2   < > 36.3 38.7 38.6 39.5 40.6  MCV 83.4 83.4   < > 83.1 81.3 83.2 82.1 82.2  PLT 434* 377   < > 338 367 376 412* 444*   < > = values in this interval not displayed.   Basic Metabolic Panel: Recent Labs  Lab 12/01/20 0325 12/02/20 0600 12/03/20 0618 12/04/20 0531 12/05/20 0345  NA 141 138 134* 137 137  K 3.3* 3.7 3.6 3.7 4.1  CL 105 106 102 100 100  CO2 25 23 24 26 26   GLUCOSE 176* 241* 277* 218* 247*  BUN <5* <5* 8 6* 5*  CREATININE 0.38* 0.41* 0.43* 0.45 0.46  CALCIUM 7.6* 8.4* 8.7* 8.8* 8.7*  MG 1.9 1.9 1.7 1.6* 2.0   GFR: Estimated Creatinine Clearance: 65.6 mL/min (by C-G formula based on SCr of 0.46 mg/dL). Liver Function Tests: Recent Labs  Lab 11/28/20 2005 11/29/20 0943 11/29/20 1826  AST 20 20 21   ALT 20 21 17   ALKPHOS 94 93 77  BILITOT 0.8 1.1 0.8  PROT 8.3* 7.6 7.2  ALBUMIN 3.7 3.5 3.1*   No results for input(s): LIPASE, AMYLASE in the last 168 hours. No results for input(s): AMMONIA in the last 168 hours. Coagulation Profile: No results for input(s): INR, PROTIME in the last 168 hours. Cardiac Enzymes: No results for input(s): CKTOTAL, CKMB, CKMBINDEX, TROPONINI in the last 168 hours. BNP (last 3 results) No results for input(s): PROBNP in the last 8760 hours. HbA1C: No results for input(s): HGBA1C in the last 72 hours. CBG: Recent Labs  Lab 12/04/20 0734 12/04/20 1215 12/04/20 1639 12/04/20 2150 12/05/20 0741  GLUCAP 226* 296* 251* 353* 252*   Lipid Profile: No results for input(s): CHOL, HDL, LDLCALC, TRIG, CHOLHDL, LDLDIRECT in the last 72 hours. Thyroid Function Tests: No results for input(s): TSH, T4TOTAL, FREET4, T3FREE, THYROIDAB in the last 72 hours. Anemia Panel: No results for input(s): VITAMINB12, FOLATE, FERRITIN, TIBC, IRON, RETICCTPCT in the last 72 hours. Sepsis Labs: Recent Labs  Lab 11/29/20 1323 11/29/20 1421 11/29/20 1826 11/30/20 0823  LATICACIDVEN 2.8* 2.7* 2.6* 1.1    Recent Results (from the  past 240 hour(s))  SARS CORONAVIRUS 2 (TAT 6-24 HRS) Nasopharyngeal Nasopharyngeal Swab     Status: None   Collection Time: 11/29/20  6:02 AM   Specimen: Nasopharyngeal Swab  Result Value Ref Range Status   SARS Coronavirus 2 NEGATIVE NEGATIVE Final    Comment: (NOTE) SARS-CoV-2 target nucleic  acids are NOT DETECTED.  The SARS-CoV-2 RNA is generally detectable in upper and lower respiratory specimens during the acute phase of infection. Negative results do not preclude SARS-CoV-2 infection, do not rule out co-infections with other pathogens, and should not be used as the sole basis for treatment or other patient management decisions. Negative results must be combined with clinical observations, patient history, and epidemiological information. The expected result is Negative.  Fact Sheet for Patients: HairSlick.no  Fact Sheet for Healthcare Providers: quierodirigir.com  This test is not yet approved or cleared by the [place] FDA and  has been authorized for detection and/or diagnosis of SARS-CoV-2 by FDA under an Emergency Use Authorization (EUA). This EUA will remain  in effect (meaning this test can be used) for the duration of the COVID-19 declaration under Se ction 564(b)(1) of the Act, 21 U.S.C. section 360bbb-3(b)(1), unless the authorization is terminated or revoked sooner.  Performed at [facility], [address] [place], [place] [address]   Urine culture     Status: Abnormal (Preliminary result)   Collection Time: 11/29/20 10:05 AM   Specimen: Urine, Random  Result Value Ref Range Status   Specimen Description   Final    URINE, RANDOM Performed at [facility], [address] [place], [place] [address]    Special Requests   Final    NONE Performed at [facility], [address] [place], [place] [address]    Culture (A)  Final    70,000 COLONIES/mL CANDIDA ALBICANS Sent  to [facility] for further susceptibility testing. Performed at [facility], [address], [place], [place] [address]    Report Status PENDING  Incomplete  Culture, blood (routine x 2)     Status: None   Collection Time: 11/29/20 10:53 AM   Specimen: BLOOD  Result Value Ref Range Status   Specimen Description BLOOD LEFT ANTECUBITAL  Final   Special Requests   Final    BOTTLES DRAWN AEROBIC AND ANAEROBIC Blood Culture results may not be optimal due to an excessive volume of blood received in culture bottles   Culture   Final    NO GROWTH 5 DAYS Performed at [facility], [address], [place], [place] [address]    Report Status 12/04/2020 FINAL  Final  Culture, blood (routine x 2)     Status: None   Collection Time: 11/29/20  2:21 PM   Specimen: BLOOD  Result Value Ref Range Status   Specimen Description BLOOD LEFT ANTECUBITAL  Final   Special Requests   Final    BOTTLES DRAWN AEROBIC AND ANAEROBIC Blood Culture adequate volume   Culture   Final    NO GROWTH 5 DAYS Performed at [facility], [address] [place], [place] [address]    Report Status 12/04/2020 FINAL  Final         Radiology Studies: No results found.      Scheduled Meds: . atorvastatin  40 mg Oral QHS  . docusate sodium  100 mg Oral BID  . enoxaparin (LOVENOX) injection  40 mg Subcutaneous Q24H  . fluconazole  200 mg Oral Daily  . insulin aspart  0-9 Units Subcutaneous TID WC  . multivitamin with minerals  1 tablet Oral Daily  . nystatin ointment   Topical BID  . Ensure Max Protein  11 oz Oral BID  . sodium chloride flush  3 mL Intravenous Q12H   Continuous Infusions:    LOS: 6  days    Time spent: 32 mins     Charise Killian, MD [facility] Pager [phone]  If 7PM-7AM, please contact night-coverage 12/05/2020, 7:57 AM

## 2020-12-05 NOTE — Progress Notes (Addendum)
Inpatient Diabetes Program Recommendations  AACE/ADA: New Consensus Statement on Inpatient Glycemic Control (2015)  Target Ranges:  Prepandial:   less than 140 mg/dL      [facility] postprandial:   less than 180 mg/dL (1-2 hours)      Critically ill patients:  140 - 180 mg/dL   Results for Desiree Espinoza, Desiree Espinoza (MRN [id number]) as of 12/05/2020 07:03  Ref. Range 12/04/2020 07:34 12/04/2020 12:15 12/04/2020 16:39 12/04/2020 21:50  Glucose-Capillary Latest Ref Range: 70 - 99 mg/dL 564 (H)  3 units NOVOLOG  296 (H)  5 units NOVOLOG  251 (H)  5 units NOVOLOG  353 (H)   Results for Desiree Espinoza, Desiree Espinoza (MRN [id number]) as of 12/05/2020 07:03  Ref. Range 12/05/2020 03:45  Glucose Latest Ref Range: 70 - 99 mg/dL 166 (H)    Home DM Meds: Metformin 1000 mg BID                             Glipizide 10 mg BID  Current Orders: Novolog Sensitive Correction Scale/ SSI (0-9 units) TID AC    MD- While home oral meds are on hold, please consider adding basal insulin.  AM CBGs have been elevated the last 2 days.   1. Consider adding Lantus 6 units Daily (0.1 units/kg)  2. Please also consider increasing the Novolog SSI to the Moderate scale (0-15 units) and may consider increasing the frequency to Q4 hours as it looks like pt not eating well per PO documentation     --Will follow patient during hospitalization--  Ambrose Finland RN, MSN, CDE Diabetes Coordinator Inpatient Glycemic Control Team Team Pager: [phone] (8a-5p)

## 2020-12-05 NOTE — Care Management (Signed)
    Durable Medical Equipment  (From admission, onward)         Start     Ordered   12/05/20 1404  For home use only DME Hospital bed  Once       Question Answer Comment  Length of Need Lifetime   Patient has (list medical condition): MS   The above medical condition requires: Patient requires the ability to reposition frequently   Head must be elevated greater than: 30 degrees   Bed type Semi-electric   Hoyer Lift Yes   Support Surface: Gel Overlay      12/05/20 1404

## 2020-12-06 LAB — BASIC METABOLIC PANEL
Anion gap: 11 (ref 5–15)
BUN: 7 mg/dL — ABNORMAL LOW (ref 8–23)
CO2: 25 mmol/L (ref 22–32)
Calcium: 8.6 mg/dL — ABNORMAL LOW (ref 8.9–10.3)
Chloride: 101 mmol/L (ref 98–111)
Creatinine, Ser: 0.43 mg/dL — ABNORMAL LOW (ref 0.44–1.00)
GFR, Estimated: >60 mL/min (ref >60)
Glucose, Bld: 203 mg/dL — ABNORMAL HIGH (ref 70–99)
Potassium: 3.9 mmol/L (ref 3.5–5.1)
Sodium: 137 mmol/L (ref 135–145)

## 2020-12-06 LAB — CBC
HCT: 39.3 % (ref 36.0–46.0)
Hemoglobin: 12.4 g/dL (ref 12.0–15.0)
MCH: 26.3 pg (ref 26.0–34.0)
MCHC: 31.6 g/dL (ref 30.0–36.0)
MCV: 83.4 fL (ref 80.0–100.0)
Platelets: 409 10*3/uL — ABNORMAL HIGH (ref 150–400)
RBC: 4.71 MIL/uL (ref 3.87–5.11)
RDW: 15.5 % (ref 11.5–15.5)
WBC: 9.2 10*3/uL (ref 4.0–10.5)
nRBC: 0 % (ref 0.0–0.2)

## 2020-12-06 LAB — GLUCOSE, CAPILLARY
Glucose-Capillary: 211 mg/dL — ABNORMAL HIGH (ref 70–99)
Glucose-Capillary: 286 mg/dL — ABNORMAL HIGH (ref 70–99)

## 2020-12-06 MED ORDER — NYSTATIN 100000 UNIT/GM EX OINT: TOPICAL_OINTMENT | Freq: Two times a day (BID) | CUTANEOUS | 0 refills | Status: AC

## 2020-12-06 NOTE — Plan of Care (Signed)
  Problem: Health Behavior/Discharge Planning: Goal: Ability to manage health-related needs will improve Outcome: Progressing   Problem: Activity: Goal: Risk for activity intolerance will decrease Outcome: Progressing   Problem: Nutrition: Goal: Adequate nutrition will be maintained Outcome: Progressing   Problem: Coping: Goal: Level of anxiety will decrease Outcome: Progressing   Problem: Elimination: Goal: Will not experience complications related to bowel motility Outcome: Progressing Goal: Will not experience complications related to urinary retention Outcome: Progressing   Problem: Pain Managment: Goal: General experience of comfort will improve Outcome: Progressing   Problem: Safety: Goal: Ability to remain free from injury will improve Outcome: Progressing   Problem: Skin Integrity: Goal: Risk for impaired skin integrity will decrease Outcome: Progressing   

## 2020-12-06 NOTE — TOC Progression Note (Signed)
Transition of Care [facility]) - Progression Note    Patient Details  Name: Desiree Espinoza MRN: [id number] Date of Birth: Jan 30, 1956  Transition of Care [facility]) CM/SW Contact  Chapman Fitch, RN Phone Number: 12/06/2020, 2:58 PM  Clinical Narrative:     Daughter confirmed delivery of bed.  EMS transport call EMS packet on chart  Kandee Keen with [facility] notified of discharge   Expected Discharge Plan: Skilled Nursing Facility Barriers to Discharge: No Barriers Identified  Expected Discharge Plan and Services Expected Discharge Plan: Skilled Nursing Facility     Post Acute Care Choice: Skilled Nursing Facility Living arrangements for the past 2 months: Single Family Home Expected Discharge Date: 12/06/20                         [facility] Arranged: RN,PT,OT,Nurse's Aide,Social Work [employer] Agency: [facility] Date [facility] Agency Contacted: 12/06/20   Representative spoke with at [facility] Agency: Kandee Keen   Social Determinants of Health (SDOH) Interventions    Readmission Risk Interventions No flowsheet data found.

## 2020-12-06 NOTE — Discharge Summary (Signed)
Prem Moyeda NHA:[id number] DOB: Dec 27, 1955 DOA: 11/28/2020  PCP: Preston Fleeting, MD  Admit date: 11/28/2020 Discharge date: 12/06/2020  Admitted From: home Disposition:  home  Recommendations for Outpatient Follow-up:  1. Follow up with PCP in 1 week 2. Please obtain BMP/CBC in one week   Home Health:yes   Discharge Condition:Stable CODE STATUS: Full Diet recommendation: Carb control     Brief/Interim Summary:  Per HPI- 65 y/o F w/ PMH of multiple sclerosis, DM2, HLD who presents w/ urinary frequency x 2 days.  Pt was recently treated for UTI & yeast infection but pt did not remember what she took.  Patient was admitted with sepsis and UTI.   Sepsis: w/ hypotension, tachycardia, elevated lactic acid & likely UTI &/or yeast infection. Resolved    Yeast infection:  Urine culture 70,000 Candida albicans and  [facility] will be completing sensitivity She was treated with fluconazole.  Thrombocytosis: etiology unclear, likely reactive. improving  Hypokalemia:stable  Leukocytosis: resolved   Lactic acidosis: resolved   DM2: poorly controlled w/ HbA1c 10.8 .  followup with pcp for further management  MS: not on any meds. Wheelchair bound.pt's daughter is going to take the pt home w/ home health, hospital bed.    Discharge Diagnoses:                                                                                                                 Active Problems:   UTI (urinary tract infection)    Discharge Instructions  Discharge Instructions    Call MD for:  temperature >100.4   Complete by: As directed    Diet - low sodium heart healthy   Complete by: As directed    Discharge instructions   Complete by: As directed    Drink protein shake bid   Increase activity slowly   Complete by: As directed      Allergies as of 12/06/2020   No Known Allergies     Medication List    STOP taking these medications   losartan 25 MG tablet Commonly known as:  COZAAR     TAKE these medications   atorvastatin 40 MG tablet Commonly known as: LIPITOR Take 40 mg by mouth at bedtime.   docusate sodium 100 MG capsule Commonly known as: COLACE Take 100 mg by mouth 2 (two) times daily.   glipiZIDE 10 MG tablet Commonly known as: GLUCOTROL Take 10 mg by mouth 2 (two) times daily before a meal.   metFORMIN 1000 MG tablet Commonly known as: GLUCOPHAGE Take 1,000 mg by mouth 2 (two) times daily with a meal.   nystatin ointment Commonly known as: MYCOSTATIN Apply topically 2 (two) times daily.            Durable Medical Equipment  (From admission, onward)         Start     Ordered   12/05/20 1404  For home use only DME Hospital bed  Once       Question Answer Comment  Length of  Need Lifetime   Patient has (list medical condition): MS   The above medical condition requires: Patient requires the ability to reposition frequently   Head must be elevated greater than: 30 degrees   Bed type Semi-electric   Hoyer Lift Yes   Support Surface: Gel Overlay      12/05/20 1404          Follow-up Information    Revelo, Presley Raddle, MD. Go on 12/13/2020.   Specialty: Family Medicine Why: 11am appointment Contact information: [address] [place] [address] [phone]              No Known Allergies  Consultations:     Procedures/Studies: DG ABD ACUTE 2+V W 1V CHEST  Result Date: 11/29/2020 CLINICAL DATA:  Nausea and loss of appetite EXAM: DG ABDOMEN ACUTE WITH 1 VIEW CHEST COMPARISON:  Chest radiograph December 17, 2018 FINDINGS: AP chest: Lungs are clear. Heart size and pulmonary vascularity are normal. No adenopathy. Supine and upright abdomen: There is moderate stool in the colon. There is no bowel dilatation or air-fluid level to suggest bowel obstruction. No free air. There are surgical clips in the gallbladder fossa region. No abnormal calcifications. IMPRESSION: Moderate stool in colon. No bowel  obstruction or free air. Lungs clear. Electronically Signed   By: Bretta Bang III M.D.   On: 11/29/2020 11:40   CT Renal Stone Study  Result Date: 11/29/2020 CLINICAL DATA:  65 year old female with hematuria and UTI. EXAM: CT ABDOMEN AND PELVIS WITHOUT CONTRAST TECHNIQUE: Multidetector CT imaging of the abdomen and pelvis was performed following the standard protocol without IV contrast. COMPARISON:  None. FINDINGS: Please note that parenchymal abnormalities may be missed without intravenous contrast. Lower chest: Bibasilar subsegmental atelectasis/scarring is noted. Hepatobiliary: The liver is unremarkable. The patient is status post cholecystectomy. No biliary dilatation. Pancreas: Unremarkable Spleen: Unremarkable Adrenals/Urinary Tract: Circumferential wall thickening of the bladder with adjacent stranding/inflammation likely represents infection. There is equivocal wall thickening of the ureters likely representing infection as well. There is no evidence of hydronephrosis, urinary gas or urinary calculi. The kidneys are otherwise unremarkable. The adrenal glands are unremarkable. Stomach/Bowel: Stomach is within normal limits. Appendix appears normal. No evidence of bowel wall thickening, distention, or inflammatory changes. Vascular/Lymphatic: Aortic atherosclerosis. No enlarged abdominal or pelvic lymph nodes. Reproductive: Uterus and bilateral adnexa are unremarkable. Other: No ascites, focal collection or pneumoperitoneum. Musculoskeletal: No acute or suspicious bony abnormalities. IMPRESSION: 1. Circumferential wall thickening of the bladder with adjacent stranding/inflammation likely representing infection. Equivocal wall thickening of the ureters likely representing infection/UTI as well. No evidence of hydronephrosis, urinary gas or urinary calculi. 2. Aortic Atherosclerosis (ICD10-I70.0). Electronically Signed   By: Harmon Pier M.D.   On: 11/29/2020 14:18       Subjective: No  complaints.  Discharge Exam: Vitals:   12/06/20 0724 12/06/20 1144  BP: 109/87 113/74  Pulse: (!) 110 (!) 110  Resp: 17 18  Temp: 98.1 F (36.7 C) 98.3 F (36.8 C)  SpO2: 97% 95%   Vitals:   12/05/20 2311 12/06/20 0401 12/06/20 0724 12/06/20 1144  BP: 102/64 94/66 109/87 113/74  Pulse: (!) 114 (!) 113 (!) 110 (!) 110  Resp: 16 16 17 18   Temp: 98.7 F (37.1 C) 98.3 F (36.8 C) 98.1 F (36.7 C) 98.3 F (36.8 C)  TempSrc: Oral Oral Oral Oral  SpO2: 100% 95% 97% 95%  Weight:      Height:        General: Pt  is alert, awake, not in acute distress Cardiovascular: RRR, S1/S2 +, no rubs, no gallops Respiratory: CTA bilaterally, no wheezing, no rhonchi Abdominal: Soft, NT, ND, bowel sounds + Extremities: no edema, no cyanosis    The results of significant diagnostics from this hospitalization (including imaging, microbiology, ancillary and laboratory) are listed below for reference.     Microbiology: Recent Results (from the past 240 hour(s))  SARS CORONAVIRUS 2 (TAT 6-24 HRS) Nasopharyngeal Nasopharyngeal Swab     Status: None   Collection Time: 11/29/20  6:02 AM   Specimen: Nasopharyngeal Swab  Result Value Ref Range Status   SARS Coronavirus 2 NEGATIVE NEGATIVE Final    Comment: (NOTE) SARS-CoV-2 target nucleic acids are NOT DETECTED.  The SARS-CoV-2 RNA is generally detectable in upper and lower respiratory specimens during the acute phase of infection. Negative results do not preclude SARS-CoV-2 infection, do not rule out co-infections with other pathogens, and should not be used as the sole basis for treatment or other patient management decisions. Negative results must be combined with clinical observations, patient history, and epidemiological information. The expected result is Negative.  Fact Sheet for Patients: HairSlick.no  Fact Sheet for Healthcare Providers: quierodirigir.com  This test is not  yet approved or cleared by the [place] FDA and  has been authorized for detection and/or diagnosis of SARS-CoV-2 by FDA under an Emergency Use Authorization (EUA). This EUA will remain  in effect (meaning this test can be used) for the duration of the COVID-19 declaration under Se ction 564(b)(1) of the Act, 21 U.S.C. section 360bbb-3(b)(1), unless the authorization is terminated or revoked sooner.  Performed at [facility], [address] [place], [place] [address]   Urine culture     Status: Abnormal (Preliminary result)   Collection Time: 11/29/20 10:05 AM   Specimen: Urine, Random  Result Value Ref Range Status   Specimen Description   Final    URINE, RANDOM Performed at [facility], [address] [place], [place] [address]    Special Requests   Final    NONE Performed at [facility], [address] [place], [place] [address]    Culture (A)  Final    70,000 COLONIES/mL CANDIDA ALBICANS Sent to [facility] for further susceptibility testing. Performed at [facility], [address] [place], [place] [address]    Report Status PENDING  Incomplete  Culture, blood (routine x 2)     Status: None   Collection Time: 11/29/20 10:53 AM   Specimen: BLOOD  Result Value Ref Range Status   Specimen Description BLOOD LEFT ANTECUBITAL  Final   Special Requests   Final    BOTTLES DRAWN AEROBIC AND ANAEROBIC Blood Culture results may not be optimal due to an excessive volume of blood received in culture bottles   Culture   Final    NO GROWTH 5 DAYS Performed at [facility], [address] [place], [place] [address]    Report Status 12/04/2020 FINAL  Final  Culture, blood (routine x 2)     Status: None   Collection Time: 11/29/20  2:21 PM   Specimen: BLOOD  Result Value Ref Range Status   Specimen Description BLOOD LEFT ANTECUBITAL  Final   Special Requests   Final    BOTTLES DRAWN AEROBIC AND ANAEROBIC Blood Culture adequate  volume   Culture   Final    NO GROWTH 5 DAYS Performed at [facility], [address], [place], [place] [address]  Report Status 12/04/2020 FINAL  Final     Labs: BNP (last 3 results) No results for input(s): BNP in the last 8760 hours. Basic Metabolic Panel: Recent Labs  Lab 12/01/20 0325 12/02/20 0600 12/03/20 0618 12/04/20 0531 12/05/20 0345 12/06/20 0353  NA 141 138 134* 137 137 137  K 3.3* 3.7 3.6 3.7 4.1 3.9  CL 105 106 102 100 100 101  CO2 25 23 24 26 26 25   GLUCOSE 176* 241* 277* 218* 247* 203*  BUN <5* <5* 8 6* 5* 7*  CREATININE 0.38* 0.41* 0.43* 0.45 0.46 0.43*  CALCIUM 7.6* 8.4* 8.7* 8.8* 8.7* 8.6*  MG 1.9 1.9 1.7 1.6* 2.0  --    Liver Function Tests: Recent Labs  Lab 11/29/20 1826  AST 21  ALT 17  ALKPHOS 77  BILITOT 0.8  PROT 7.2  ALBUMIN 3.1*   No results for input(s): LIPASE, AMYLASE in the last 168 hours. No results for input(s): AMMONIA in the last 168 hours. CBC: Recent Labs  Lab 12/02/20 0600 12/03/20 0618 12/04/20 0531 12/05/20 0345 12/06/20 0353  WBC 7.4 7.7 8.1 8.6 9.2  HGB 12.4 12.3 12.7 13.0 12.4  HCT 38.7 38.6 39.5 40.6 39.3  MCV 81.3 83.2 82.1 82.2 83.4  PLT 367 376 412* 444* 409*   Cardiac Enzymes: No results for input(s): CKTOTAL, CKMB, CKMBINDEX, TROPONINI in the last 168 hours. BNP: Invalid input(s): POCBNP CBG: Recent Labs  Lab 12/05/20 1130 12/05/20 1605 12/05/20 2041 12/06/20 0732 12/06/20 1140  GLUCAP 292* 269* 218* 211* 286*   D-Dimer No results for input(s): DDIMER in the last 72 hours. Hgb A1c No results for input(s): HGBA1C in the last 72 hours. Lipid Profile No results for input(s): CHOL, HDL, LDLCALC, TRIG, CHOLHDL, LDLDIRECT in the last 72 hours. Thyroid function studies No results for input(s): TSH, T4TOTAL, T3FREE, THYROIDAB in the last 72 hours.  Invalid input(s): FREET3 Anemia work up No results for input(s): VITAMINB12, FOLATE, FERRITIN, TIBC, IRON, RETICCTPCT in the last 72  hours. Urinalysis    Component Value Date/Time   COLORURINE AMBER (A) 11/29/2020 1005   APPEARANCEUR TURBID (A) 11/29/2020 1005   LABSPEC 1.026 11/29/2020 1005   PHURINE 5.0 11/29/2020 1005   GLUCOSEU >=500 (A) 11/29/2020 1005   HGBUR SMALL (A) 11/29/2020 1005   BILIRUBINUR NEGATIVE 11/29/2020 1005   KETONESUR 20 (A) 11/29/2020 1005   PROTEINUR 100 (A) 11/29/2020 1005   NITRITE NEGATIVE 11/29/2020 1005   LEUKOCYTESUR LARGE (A) 11/29/2020 1005   Sepsis Labs Invalid input(s): PROCALCITONIN,  WBC,  LACTICIDVEN Microbiology Recent Results (from the past 240 hour(s))  SARS CORONAVIRUS 2 (TAT 6-24 HRS) Nasopharyngeal Nasopharyngeal Swab     Status: None   Collection Time: 11/29/20  6:02 AM   Specimen: Nasopharyngeal Swab  Result Value Ref Range Status   SARS Coronavirus 2 NEGATIVE NEGATIVE Final    Comment: (NOTE) SARS-CoV-2 target nucleic acids are NOT DETECTED.  The SARS-CoV-2 RNA is generally detectable in upper and lower respiratory specimens during the acute phase of infection. Negative results do not preclude SARS-CoV-2 infection, do not rule out co-infections with other pathogens, and should not be used as the sole basis for treatment or other patient management decisions. Negative results must be combined with clinical observations, patient history, and epidemiological information. The expected result is Negative.  Fact Sheet for Patients: HairSlick.nohttps://www.fda.gov/media/138098/download  Fact Sheet for Healthcare Providers: quierodirigir.comhttps://www.fda.gov/media/138095/download  This test is not yet approved or cleared by the [place] FDA and  has been authorized for detection  and/or diagnosis of SARS-CoV-2 by FDA under an Emergency Use Authorization (EUA). This EUA will remain  in effect (meaning this test can be used) for the duration of the COVID-19 declaration under Se ction 564(b)(1) of the Act, 21 U.S.C. section 360bbb-3(b)(1), unless the authorization is terminated  or revoked sooner.  Performed at [facility], [address], [place], [place] [address]   Urine culture     Status: Abnormal (Preliminary result)   Collection Time: 11/29/20 10:05 AM   Specimen: Urine, Random  Result Value Ref Range Status   Specimen Description   Final    URINE, RANDOM Performed at [facility], [address], [place], [place] [address]    Special Requests   Final    NONE Performed at [facility], [address] [place], [place] [address]    Culture (A)  Final    70,000 COLONIES/mL CANDIDA ALBICANS Sent to [facility] for further susceptibility testing. Performed at [facility], [address], [place], [place] [address]    Report Status PENDING  Incomplete  Culture, blood (routine x 2)     Status: None   Collection Time: 11/29/20 10:53 AM   Specimen: BLOOD  Result Value Ref Range Status   Specimen Description BLOOD LEFT ANTECUBITAL  Final   Special Requests   Final    BOTTLES DRAWN AEROBIC AND ANAEROBIC Blood Culture results may not be optimal due to an excessive volume of blood received in culture bottles   Culture   Final    NO GROWTH 5 DAYS Performed at [facility], [address] [place], [place] [address]    Report Status 12/04/2020 FINAL  Final  Culture, blood (routine x 2)     Status: None   Collection Time: 11/29/20  2:21 PM   Specimen: BLOOD  Result Value Ref Range Status   Specimen Description BLOOD LEFT ANTECUBITAL  Final   Special Requests   Final    BOTTLES DRAWN AEROBIC AND ANAEROBIC Blood Culture adequate volume   Culture   Final    NO GROWTH 5 DAYS Performed at [facility], [address], [place], [place] [address]    Report Status 12/04/2020 FINAL  Final     Time coordinating discharge: Over 30 minutes  SIGNED:   Lynn Ito, MD  [facility] 12/06/2020, 1:04 PM Pager   If 7PM-7AM, please contact night-coverage [email or web address] Password [email or web address]

## 2020-12-06 NOTE — Progress Notes (Signed)
Kimiya Brunelle to be D/C'd Home per MD order.  Discharge summary given to EMS.   Allergies as of 12/06/2020   No Known Allergies     Medication List    STOP taking these medications   losartan 25 MG tablet Commonly known as: COZAAR     TAKE these medications   atorvastatin 40 MG tablet Commonly known as: LIPITOR Take 40 mg by mouth at bedtime.   docusate sodium 100 MG capsule Commonly known as: COLACE Take 100 mg by mouth 2 (two) times daily.   glipiZIDE 10 MG tablet Commonly known as: GLUCOTROL Take 10 mg by mouth 2 (two) times daily before a meal.   metFORMIN 1000 MG tablet Commonly known as: GLUCOPHAGE Take 1,000 mg by mouth 2 (two) times daily with a meal.   nystatin ointment Commonly known as: MYCOSTATIN Apply topically 2 (two) times daily.            Durable Medical Equipment  (From admission, onward)         Start     Ordered   12/05/20 1404  For home use only DME Hospital bed  Once       Question Answer Comment  Length of Need Lifetime   Patient has (list medical condition): MS   The above medical condition requires: Patient requires the ability to reposition frequently   Head must be elevated greater than: 30 degrees   Bed type Semi-electric   Hoyer Lift Yes   Support Surface: Gel Overlay      12/05/20 1404          Vitals:   12/06/20 0724 12/06/20 1144  BP: 109/87 113/74  Pulse: (!) 110 (!) 110  Resp: 17 18  Temp: 98.1 F (36.7 C) 98.3 F (36.8 C)  SpO2: 97% 95%    Skin clean, dry and intact without evidence of skin break down, no evidence of skin tears noted. IV catheter discontinued intact. Site without signs and symptoms of complications. Dressing and pressure applied. Pt denies pain at this time. No complaints noted. Patient escorted via stretcher and D/C home via ambulance.  Madie Reno, RN

## 2020-12-06 NOTE — TOC Progression Note (Signed)
Transition of Care [facility]) - Progression Note    Patient Details  Name: Desiree Espinoza MRN: [id number] Date of Birth: 1955-12-04  Transition of Care [facility]) CM/SW Contact  Chapman Fitch, RN Phone Number: 12/06/2020, 9:59 AM  Clinical Narrative:      Spoke with daughter.  She states that hospital bed to be delivered between 11-12.  She will call me back directly when bed has been delivered and EMS transport will be arranged  She states they have changed their mind and no longer want [facility] for home health.  States they do not have a preference of agency.  [place] with [facility] notified.  Referral made to [facility] with [facility]  Expected Discharge Plan: Skilled Nursing Facility Barriers to Discharge: Continued Medical Work up  Expected Discharge Plan and Services Expected Discharge Plan: Skilled Nursing Facility     Post Acute Care Choice: Skilled Nursing Facility Living arrangements for the past 2 months: Single Family Home                                       Social Determinants of Health (SDOH) Interventions    Readmission Risk Interventions No flowsheet data found.

## 2020-12-07 LAB — URINE CULTURE: Culture: 70000 — AB

## 2021-05-26 IMAGING — CR DG ABDOMEN ACUTE W/ 1V CHEST
4 series · 4 of 4 positions shown · non-contrast
Comparison: Chest radiograph December 17, 2018

CLINICAL DATA: Nausea and loss of appetite

EXAM:
DG ABDOMEN ACUTE WITH 1 VIEW CHEST

[chest ap]
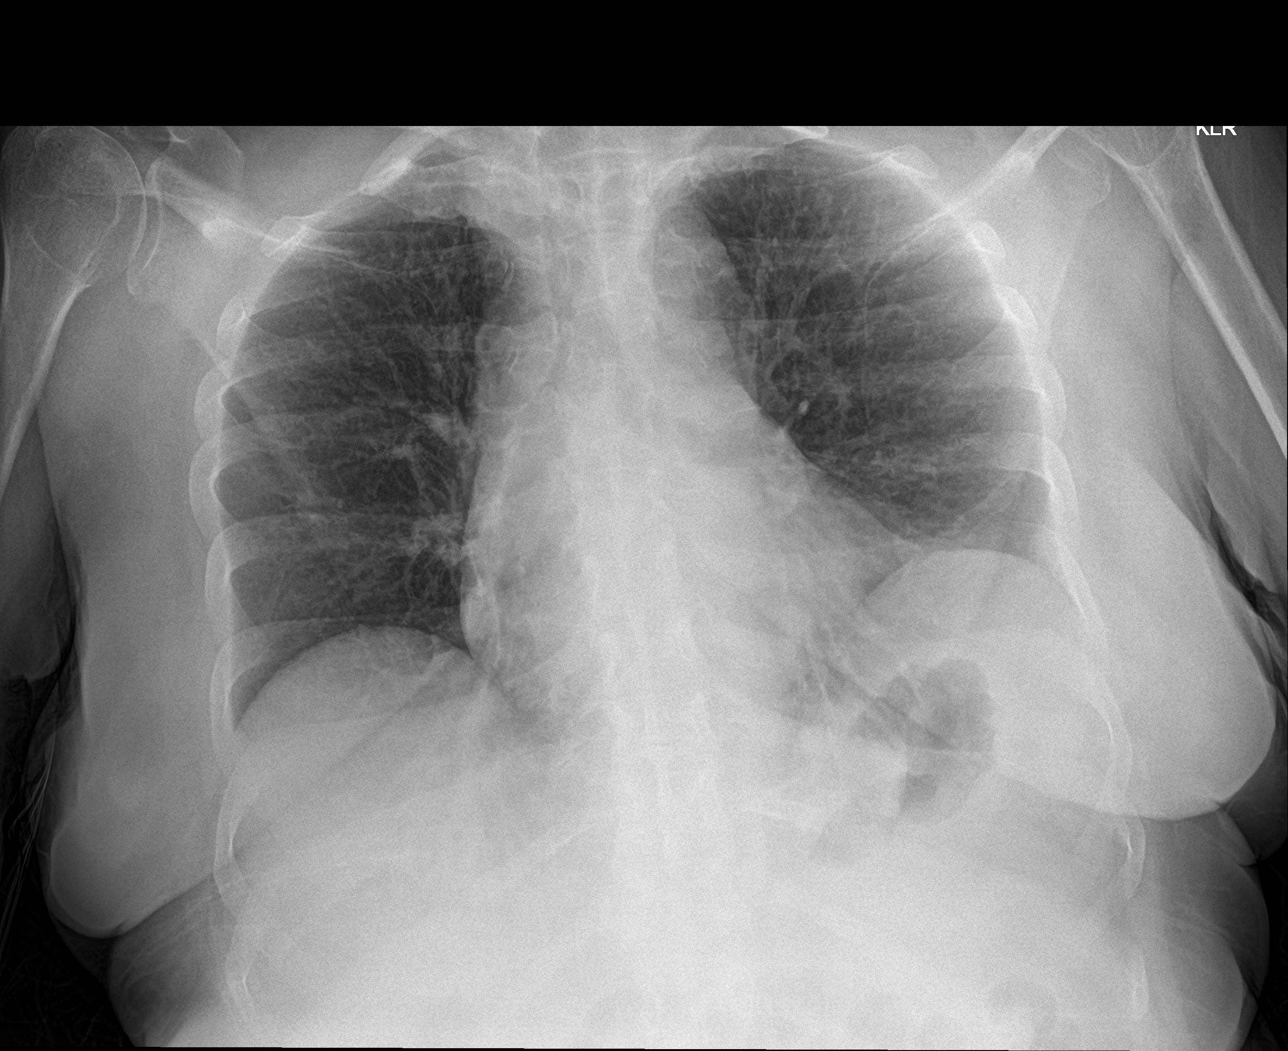

[abdomen erect]
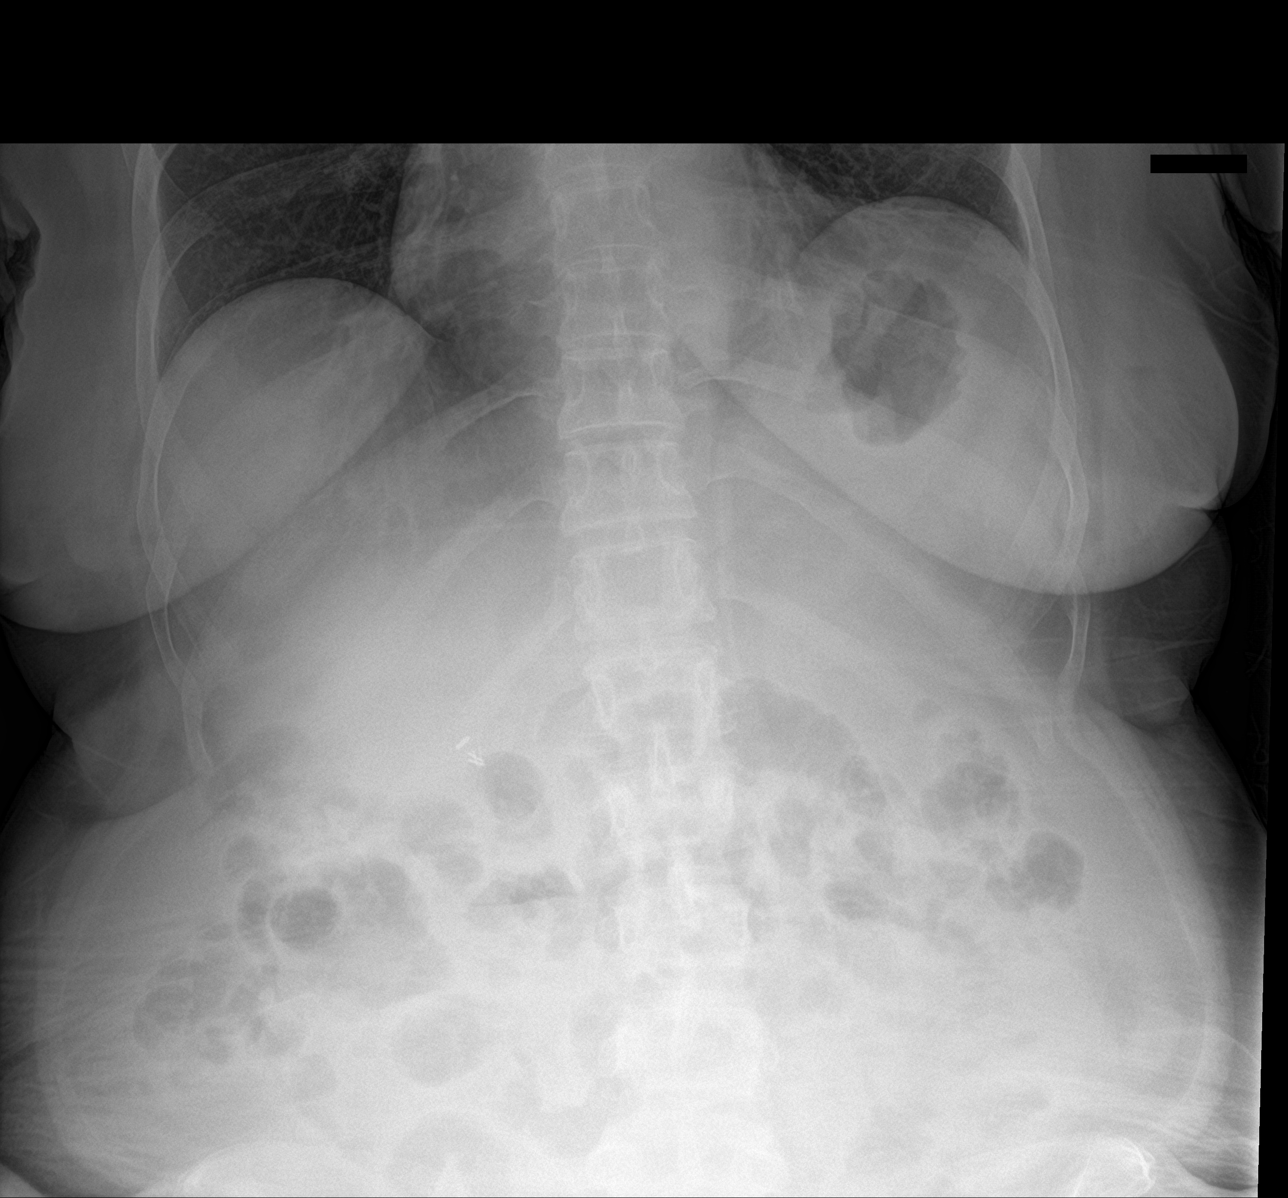

[abdomen supine (1 of 2)]
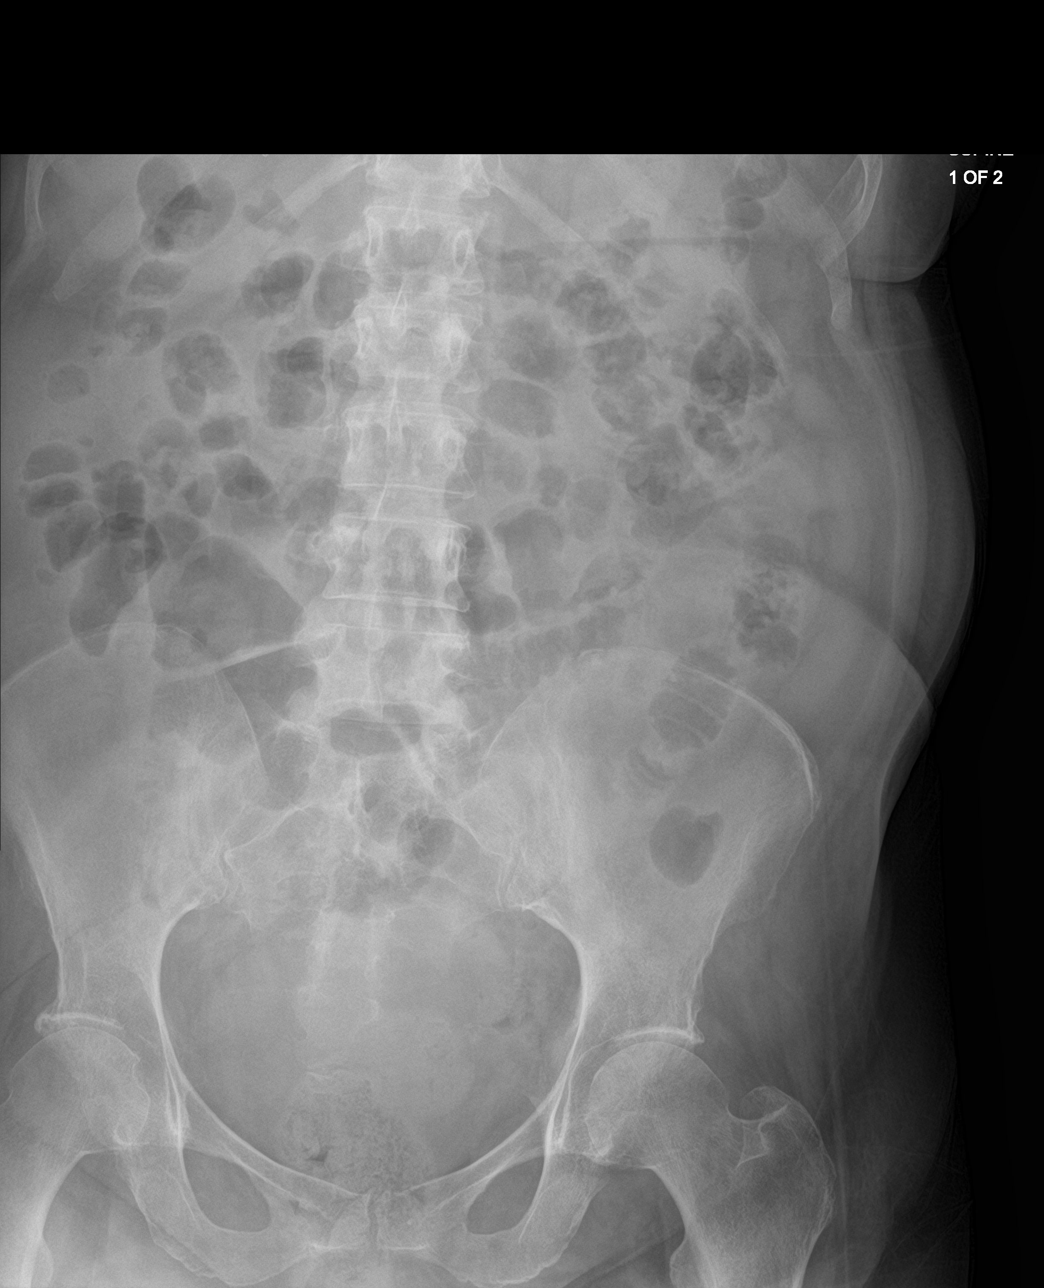

[abdomen supine (2 of 2)]
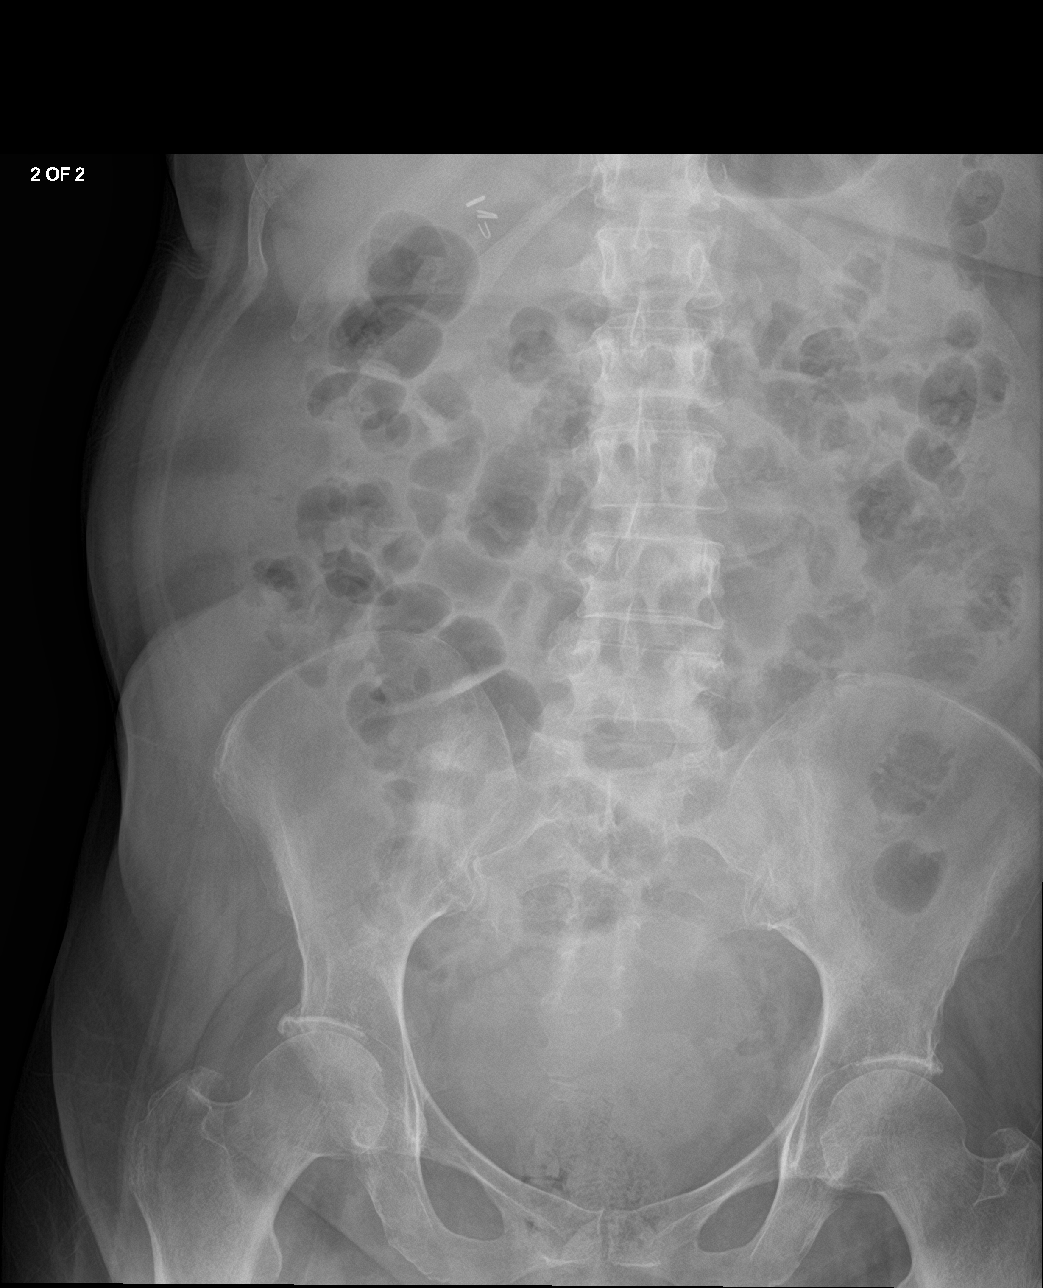

[4 of 4 positions shown; findings below may reference images not displayed]

FINDINGS: AP chest: Lungs are clear. Heart size and pulmonary vascularity are
normal. No adenopathy.

Supine and upright abdomen: There is moderate stool in the colon.
There is no bowel dilatation or air-fluid level to suggest bowel
obstruction. No free air. There are surgical clips in the
gallbladder fossa region. No abnormal calcifications.
IMPRESSION: Moderate stool in colon. No bowel obstruction or free air. Lungs
clear.
